# Patient Record
Sex: Male | Born: 1995 | Race: White | Hispanic: No | Marital: Single | Smoking: Current every day smoker
Health system: Southern US, Community
[De-identification: ages and names within clinical notes are randomized; demographics above are authoritative.]

## PROBLEM LIST (undated history)

## (undated) DIAGNOSIS — I219 Acute myocardial infarction, unspecified: Secondary | ICD-10-CM

## (undated) DIAGNOSIS — I498 Other specified cardiac arrhythmias: Secondary | ICD-10-CM

## (undated) DIAGNOSIS — Z9289 Personal history of other medical treatment: Secondary | ICD-10-CM

## (undated) DIAGNOSIS — Z9889 Other specified postprocedural states: Secondary | ICD-10-CM

## (undated) HISTORY — PX: NOSE SURGERY: SHX723

## (undated) HISTORY — PX: OTHER SURGICAL HISTORY: SHX169

---
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
## 2014-02-23 ENCOUNTER — Observation Stay (HOSPITAL_COMMUNITY)
Admission: EM | Admit: 2014-02-23 | Discharge: 2014-02-24 | Disposition: A | Discharge status: Home / Self Care | Payer: Medicaid Other | Attending: Family Medicine | Admitting: Family Medicine

## 2014-02-23 ENCOUNTER — Encounter (HOSPITAL_COMMUNITY): Payer: Self-pay | Admitting: Emergency Medicine

## 2014-02-23 ENCOUNTER — Emergency Department (HOSPITAL_COMMUNITY): Payer: Medicaid Other

## 2014-02-23 DIAGNOSIS — F17200 Nicotine dependence, unspecified, uncomplicated: Secondary | ICD-10-CM | POA: Insufficient documentation

## 2014-02-23 DIAGNOSIS — R079 Chest pain, unspecified: Secondary | ICD-10-CM

## 2014-02-23 DIAGNOSIS — R9431 Abnormal electrocardiogram [ECG] [EKG]: Secondary | ICD-10-CM | POA: Insufficient documentation

## 2014-02-23 DIAGNOSIS — Z23 Encounter for immunization: Secondary | ICD-10-CM | POA: Insufficient documentation

## 2014-02-23 HISTORY — DX: Acute myocardial infarction, unspecified: I21.9

## 2014-02-23 LAB — CBC WITH DIFFERENTIAL/PLATELET
Basophils Absolute: 0 10*3/uL (ref 0.0–0.1)
Basophils Relative: 0 % (ref 0–1)
Eosinophils Absolute: 0.1 10*3/uL (ref 0.0–0.7)
Eosinophils Relative: 2 % (ref 0–5)
HEMATOCRIT: 48.3 % (ref 39.0–52.0)
HEMOGLOBIN: 17.1 g/dL — AB (ref 13.0–17.0)
LYMPHS PCT: 33 % (ref 12–46)
Lymphs Abs: 2.2 10*3/uL (ref 0.7–4.0)
MCH: 31.6 pg (ref 26.0–34.0)
MCHC: 35.4 g/dL (ref 30.0–36.0)
MCV: 89.3 fL (ref 78.0–100.0)
MONO ABS: 0.6 10*3/uL (ref 0.1–1.0)
MONOS PCT: 10 % (ref 3–12)
NEUTROS ABS: 3.7 10*3/uL (ref 1.7–7.7)
Neutrophils Relative %: 55 % (ref 43–77)
Platelets: 203 10*3/uL (ref 150–400)
RBC: 5.41 MIL/uL (ref 4.22–5.81)
RDW: 12 % (ref 11.5–15.5)
WBC: 6.6 10*3/uL (ref 4.0–10.5)

## 2014-02-23 LAB — BASIC METABOLIC PANEL
BUN: 11 mg/dL (ref 6–23)
CHLORIDE: 100 meq/L (ref 96–112)
CO2: 25 meq/L (ref 19–32)
CREATININE: 0.8 mg/dL (ref 0.50–1.35)
Calcium: 9.3 mg/dL (ref 8.4–10.5)
GFR calc Af Amer: >90 mL/min (ref >90)
GFR calc non Af Amer: >90 mL/min (ref >90)
GLUCOSE: 103 mg/dL — AB (ref 70–99)
Potassium: 3.8 mEq/L (ref 3.7–5.3)
Sodium: 137 mEq/L (ref 137–147)

## 2014-02-23 LAB — RAPID URINE DRUG SCREEN, HOSP PERFORMED
Amphetamines: NOT DETECTED
BARBITURATES: NOT DETECTED
Benzodiazepines: NOT DETECTED
Cocaine: NOT DETECTED
Opiates: NOT DETECTED
Tetrahydrocannabinol: POSITIVE — AB

## 2014-02-23 LAB — D-DIMER, QUANTITATIVE: D-Dimer, Quant: 0.4 ug/mL-FEU (ref 0.00–0.48)

## 2014-02-23 LAB — TROPONIN I
Troponin I: <0.3 ng/mL (ref <0.30)
Troponin I: <0.3 ng/mL (ref <0.30)

## 2014-02-23 MED ORDER — NITROGLYCERIN 0.4 MG SL SUBL
0.4000 mg | SUBLINGUAL_TABLET | SUBLINGUAL | Status: AC | PRN
  Administered 2014-02-23 (×3): 0.4 mg via SUBLINGUAL
  Filled 2014-02-23: qty 1

## 2014-02-23 MED ORDER — NITROGLYCERIN 0.4 MG SL SUBL
0.4000 mg | SUBLINGUAL_TABLET | SUBLINGUAL | Status: DC | PRN
  Administered 2014-02-24: 0.4 mg via SUBLINGUAL
  Filled 2014-02-23: qty 1

## 2014-02-23 MED ORDER — MORPHINE SULFATE 2 MG/ML IJ SOLN: 2.0000 mg | INTRAMUSCULAR | Status: DC | PRN

## 2014-02-23 MED ORDER — ACETAMINOPHEN 325 MG PO TABS: 650.0000 mg | ORAL_TABLET | Freq: Four times a day (QID) | ORAL | Status: DC | PRN

## 2014-02-23 MED ORDER — NIFEDIPINE ER OSMOTIC RELEASE 30 MG PO TB24
30.0000 mg | ORAL_TABLET | Freq: Every day | ORAL | Status: DC
  Administered 2014-02-24: 30 mg via ORAL
  Filled 2014-02-23: qty 1

## 2014-02-23 MED ORDER — ASPIRIN 81 MG PO CHEW
324.0000 mg | CHEWABLE_TABLET | Freq: Once | ORAL | Status: AC
  Administered 2014-02-23: 324 mg via ORAL
  Filled 2014-02-23: qty 4

## 2014-02-23 MED ORDER — PNEUMOCOCCAL VAC POLYVALENT 25 MCG/0.5ML IJ INJ
0.5000 mL | INJECTION | INTRAMUSCULAR | Status: AC
  Administered 2014-02-24: 0.5 mL via INTRAMUSCULAR
  Filled 2014-02-23: qty 0.5

## 2014-02-23 MED ORDER — SODIUM CHLORIDE 0.9 % IV BOLUS (SEPSIS)
500.0000 mL | Freq: Once | INTRAVENOUS | Status: AC
  Administered 2014-02-23: 500 mL via INTRAVENOUS

## 2014-02-23 NOTE — ED Provider Notes (Signed)
CSN: [id number]     Arrival date & time 02/23/14  1033 History  This chart was scribed for American Expressathan R. Rubin PayorPickering, MD by Quintella ReichertMatthew Underwood, ED scribe.  This patient was seen in room APA06/[facility]06 and the patient's care was started at 10:46 AM.   Chief Complaint  Patient presents with  . Chest Pain    The history is provided by the patient. No language interpreter was used.    HPI Comments: Chad Craig is a 18 y.o. 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  Pt states he had some mild cramping in his chest last night but he was not concerned at that time and it resolved quickly.  This morning he awoke at 9:07 AM with sharp, severe, pain in the left side of his chest which has been constant since then.  He characterizes pain as similar to his prior MI except pain radiated down left arm on that occasion and his current pain is non-radiating.  Pain is worsened by deep breathing but he denies any other SOB.  He denies leg swelling, fever, or cough.  Pt reports he has otherwise been fine for the past few days and has been able to engage in physical activity without difficulty.  He reports that this is the first episode of chest pain he has had in some time.  He sees a cardiologist every 6 months and at his most recent appointment was told "everything looks great."  He notes that he experienced intermittent chest pain for the first year after his MI but but since then he has had very little issues.  His most recent cardiac issue was this year in January when he "almost passed out."  Pt has been given nitroglycerin in previous episodes but they did not help and pt states "they just made it worse."  He is a smoker.  He denies any other drug use including cocaine.   Past Medical History  Diagnosis Date  . Myocardial infarction     per  family and pt    Past Surgical History  Procedure Laterality Date  . Cardiac catheterization      No family history on file.   History  Substance Use Topics  . Smoking status: Current Every Day Smoker    Types: Cigarettes  . Smokeless tobacco: Not on file  . Alcohol Use: Yes     Review of Systems  Constitutional: Negative for fever.  Respiratory: Negative for cough and shortness of breath.   Cardiovascular: Positive for chest pain. Negative for leg swelling.  All other systems reviewed and are negative.     Allergies  Review of patient's allergies indicates no known allergies.  Home Medications   Prior to Admission medications   Not on File   BP 116/79  Pulse 65  Temp(Src) 97.9 F (36.6 C) (Axillary)  Resp 16  SpO2 100%  Physical Exam  Nursing note and vitals reviewed. Constitutional: He is oriented to person, place, and time. He appears well-developed and well-nourished. No distress.  HENT:  Head: Normocephalic and atraumatic.  Eyes: EOM are normal.  Neck: Neck supple. No tracheal deviation present.  Cardiovascular: Normal rate, regular rhythm and normal heart sounds.   Pulmonary/Chest: Effort normal and breath sounds normal. No respiratory distress. He has no wheezes. He has no rales.  Musculoskeletal: Normal range  of motion.  Neurological: He is alert and oriented to person, place, and time.  Skin: Skin is warm and dry.  Psychiatric: He has a normal mood and affect. His behavior is normal.    ED Course  Procedures (including critical care time)  COORDINATION OF CARE: 10:51 AM-Discussed treatment plan which includes cardiac workup with pt at bedside and pt agreed to plan.     Labs Review Labs Reviewed  CBC WITH DIFFERENTIAL - Abnormal; Notable for the following:    Hemoglobin 17.1 (*)    All other components within normal limits  BASIC METABOLIC PANEL - Abnormal; Notable for the following:    Glucose, Bld 103 (*)    All other components  within normal limits  TROPONIN I  TROPONIN I    Imaging Review Dg Chest 2 View  02/23/2014   CLINICAL DATA:  Chest pain  EXAM: CHEST  2 VIEW  COMPARISON:  07/28/2011  FINDINGS: The cardiomediastinal silhouette is unremarkable.  There is no evidence of focal airspace disease, pulmonary edema, suspicious pulmonary nodule/mass, pleural effusion, or pneumothorax. No acute bony abnormalities are identified.  IMPRESSION: No active cardiopulmonary disease.   Electronically Signed   By: [name]  Hu M.D.   On: 02/23/2014 11:36     EKG Interpretation None      Date: 02/23/2014  Rate: 69  Rhythm: Ectopic atrial rhythm versus high junctional  QRS Axis: normal  Intervals: normal  ST/T Wave abnormalities: ST elevations inferiorly  Conduction Disutrbances:none  Narrative Interpretation: Multiple EKGs done. Mild inferior ST elevation, worse in II. T wave inversion in lead III  Old EKG Reviewed: changes noted   MDM   Final diagnoses:  Chest pain    Patient with chest pain. History of troponin elevation after corner spasm versus myocarditis a few years ago. States that this feels the same. EKG shows ST changes and some T wave changes. [facility] He initially recommended that patient stay here and get an echocardiogram tomorrow, as long as there was no severe EKG changes or troponin elevation. Discussed with the hospitalist, who requested a three-hour troponin. A three-hour troponin is negative, and the hospitalist will discuss with the cardiologist.   I personally performed the services described in this documentation, which was scribed in my presence. The recorded information has been reviewed and is accurate.     Juliet RudeNathan R. Rubin PayorPickering, MD 02/23/14 1539

## 2014-02-23 NOTE — H&P (Addendum)
History and Physical  Chad Craig [id number] DOB: 08-02-1996 DOA: 02/23/2014  Referring physician: Chest pain PCP: [name],[name], MD  Cardiologist: [name], [facility]  Chief Complaint: Chest pain  HPI:  18 year old man with cardiac history outlined below presented to the emergency department with complaints of chest pain with acute onset today. Initial evaluation notable for negative troponins. Multiple EKGs, however felt to be at baseline per cardiologist. In discussion with his cardiologist: It was felt the patient could be observed here with echocardiogram planned for tomorrow.  History obtained from chart review as well as patient. He has felt well lately, had no exertional symptoms or shortness of breath, no recent travel or leg swelling. He has no history of blood clots. Today he developed acute onset of centralized as chest pain 10/10 with radiation to his right arm with nausea but no vomiting, somewhat worse with inspiration. No shortness of breath. Symptoms felt somewhat similar to 4 years ago. He denies street drugs, on prescribed medications, Adderall or stimulants.  According to cardiologist's note from 09/2013 "past history which began in with [facility] with substernal chest pain and EKG concerning for inferoposterior STEMI at the age of 18. He was taken emergently to the cath lab where LHC did not reveal any CAD. He did not have an anomalous origin of his vessels. LVEDP was normal and there was no evidence of Takotsubo cardiomyopathy on ventriculography. He had a TTE done which showed low-normal EF (50-55%), normal  diastolic function, normal right heart function and no significant valvular disease. There was no evidence of intracardiac or intrapulmonary shunt on bubble study. Hypercoaguable work-up was sent which was WNL. He had lower extremity dopplers which did not show any evidence of clot but did show enlarged lymph nodes in the groin  bilaterally. He had a cardiac MRI which showed mildly reduced EF (calculated at 44%) with mildly dilated LV and LA but no evidence of inflammation or scar. His UTox was positive only for cannabinoids, no cocaine or amphetamine. Troponin peaked at 22. His presumptive discharge diagnosis was myo-pericarditis versus possible coronary spasm. He was ultimately started on nifedipine XL 30mg  qhs"  In the emergency department now to be afebrile stable vital signs. No hypoxia. Treat with nitroglycerin twice. Troponins negative 3 hours apart. Basic metabolic panel, CBC unremarkable. Chest x-ray no acute changes. Multiple EKGs obtained showed sinus rhythm with abnormal ST segments especially II, III with abnormal T waves in the same leads. These are felt to be similar to previous EKGs per his cardiologist.  Review of Systems:  Negative for fever, visual changes, sore throat, rash, viral symptoms, new muscle aches, SOB, dysuria, bleeding, v/abdominal pain.  Past Medical History  Diagnosis Date  . Myocardial infarction     per family and pt    Past Surgical History  Procedure Laterality Date  . Cardiac catheterization      Social History:  reports that he has been smoking Cigarettes.  He has been smoking about 0.00 packs per day. He does not have any smokeless tobacco history on file. He reports that he drinks alcohol. His drug history is not on file.  No Known Allergies  No family history on file. No particular medical problems in the family.  Prior to Admission medications   Medication Sig Start Date End Date Taking? Authorizing Provider  NIFEdipine (PROCARDIA-XL/ADALAT-CC/NIFEDICAL-XL) 30 MG 24 hr tablet Take 30 mg by mouth daily.   Yes Historical Provider, MD   Physical Exam:  Filed Vitals:   02/23/14 1041 02/23/14 1100 02/23/14 1130 02/23/14 1303  BP: 116/79 116/81 112/77 111/80  Pulse: 65 67 69 67  Temp: 97.9 F (36.6 C)     TempSrc: Axillary     Resp: 16 18 16 18   SpO2: 100% 98% 98%  100%    General: Examined in emergency department.  Appears calm and comfortable.  Contact smiles. Appears very comfortable. Eyes: PERRL, normal lids, irises  ENT: grossly normal hearing, lips & tongue Neck: no LAD, masses or thyromegaly Cardiovascular: RRR, no m/r/g. No LE edema. Respiratory: CTA bilaterally, no w/r/r. Normal respiratory effort. Abdomen: soft, ntnd Skin: no rash or induration seen. Multiple tattoos. Musculoskeletal: grossly normal tone BUE/BLE Psychiatric: grossly normal mood and affect, speech fluent and appropriate Neurologic: grossly non-focal.  Wt Readings from Last 3 Encounters:  No data found for Wt    Labs on Admission:  Basic Metabolic Panel:  Recent Labs Lab 02/23/14 1056  NA 137  K 3.8  CL 100  CO2 25  GLUCOSE 103*  BUN 11  CREATININE 0.80  CALCIUM 9.3     CBC:  Recent Labs Lab 02/23/14 1056  WBC 6.6  NEUTROABS 3.7  HGB 17.1*  HCT 48.3  MCV 89.3  PLT 203    Cardiac Enzymes:  Recent Labs Lab 02/23/14 1056 02/23/14 1414  TROPONINI <0.30 <0.30      Radiological Exams on Admission: Dg Chest 2 View  02/23/2014   CLINICAL DATA:  Chest pain  EXAM: CHEST  2 VIEW  COMPARISON:  07/28/2011  FINDINGS: The cardiomediastinal silhouette is unremarkable.  There is no evidence of focal airspace disease, pulmonary edema, suspicious pulmonary nodule/mass, pleural effusion, or pneumothorax. No acute bony abnormalities are identified.  IMPRESSION: No active cardiopulmonary disease.   Electronically Signed   By: [name]  Hu M.D.   On: 02/23/2014 11:36    EKG: Independently reviewed. Multiple EKGs reviewed, shows sinus rhythm with ST abnormalities and T wave abnormalities in leads II, III, less so in aVF   Active Problems:   Chest pain   Assessment/Plan 1. Chest pain, etiology unclear. Improved with nitroglycerin. Case was discussed in detail with the patient's cardiologist Dr. [name] who has reviewed all the EKGs and clinical data. He  indicated EKG abnormalities are old when compared to studies available to him January 2013 and August 2012. The possibility of myocarditis cannot be excluded at this point. He recommended observation, 2-D echocardiogram in the morning, serial troponins. [facility]. These recommendations were discussed with the patient and with his mother and both indicated they wish to stay here. Wells score = 0. He appears quite comfortable and there are no signs to suggest dissection or vascular abnormality. 2. Tobacco dependence recommend cessation.   Plan observation. Serial troponin. EKG and echocardiogram in the morning. Treat pain.   If condition changes, troponins elevate or pain worsens, will consider transfer.  Code Status: full code  DVT prophylaxis:early ambulation Family Communication:  Disposition Plan/Anticipated LOS: obs, 24 hours  Time spent: 90 minutes  Brendia Sacksaniel Kenyana Husak, MD  [facility] Pager [phone] 02/23/2014, 4:31 PM

## 2014-02-23 NOTE — ED Notes (Signed)
Pt rates chest pain as a 5 on pain scale prior to nitro,

## 2014-02-23 NOTE — ED Notes (Signed)
Pt resting semi fowlers on stretcher watching race on tv, rates chest pain as a 5 on pain scale, states that is where the pain has been most of the time he has been having it, pt and family updated on plan of care and admission,

## 2014-02-23 NOTE — ED Notes (Signed)
Pt requesting something to eat, per Dr Rubin PayorPickering pt may eat and drink, pt's mom going to pick pt up something to eat,

## 2014-02-23 NOTE — ED Notes (Signed)
EDP notified of increasing pain.  Order for additional sl nitro

## 2014-02-23 NOTE — ED Notes (Signed)
Dr. Goodrich at bedside,

## 2014-02-23 NOTE — ED Notes (Signed)
Pt is here with 10/10 cp in left chest.  Pt denies any sob.  Pt has some nausea, no diaphoresis.  Pt states that it hurts to take a breath.  Pt reports hx of MI with "spasm of coronary artery" pt had cardiac cath at the time.  No drug use.  Mother at the bedside.

## 2014-02-24 DIAGNOSIS — R079 Chest pain, unspecified: Secondary | ICD-10-CM

## 2014-02-24 DIAGNOSIS — R072 Precordial pain: Secondary | ICD-10-CM

## 2014-02-24 LAB — TROPONIN I
Troponin I: <0.3 ng/mL (ref <0.30)
Troponin I: <0.3 ng/mL (ref <0.30)

## 2014-02-24 NOTE — Discharge Summary (Signed)
Physician Discharge Summary  Chad Craig [id number] DOB: 04-21-1996 DOA: 02/23/2014  PCP: Chad HectorNYLAND,[name], MD  Admit date: 02/23/2014 Discharge date: 02/24/2014 Cardiologist: Chad NumberAkinniran [facility]isogun, [facility]  Recommendations for Outpatient Follow-up:  1. Chest pain. Patient will followup with his primary cardiologist in the next one to 2 weeks, to be arranged by you and see per discussion with cardiologist on call.   Follow-up Information   Follow up with Chad HectorNYLAND,[name], MD. (As needed)    Specialty:  Family Medicine   Contact information:   [address] [place] [id number] [phone]       [facility] Craig [facility]. Schedule an appointment as soon as possible for a visit in 1 week.     Discharge Diagnoses:  1. Chest pain. 2. Abnormal EKG.  Discharge Condition: Improved Disposition: Home.  Diet recommendation: Regular.  Filed Weights   02/23/14 1832  Weight: 54.7 kg (120 lb 9.5 oz)    History of present illness:  18 year old man with cardiac history outlined below presented to the emergency department with complaints of chest pain with acute onset. Initial evaluation notable for negative troponins. Multiple EKGs, however felt to be at baseline per cardiologist. In discussion with his cardiologist: It was felt the patient could be observed at [facility] with echocardiogram planned.  Hospital Course:  Patient was observed overnight, troponins negative, chest pain resolved. Now completely pain-free without shortness of breath.   1. Chest pain. Completely resolved. Etiology and significance unclear. This was discussed in detail with the patient's cardiologist Dr. Aliene Craig 5/3 felt at that time admission EKGs were baseline for patient. He recommended echocardiogram and serial troponin. Troponins negative. Echocardiogram unremarkable. EKG remains abnormal. Patient's primary cardiologist is out of town, on-call cardiologist recommended discussing with  cardiology here. Case reviewed in detail with Dr. Diona Craig who has reviewed all EKGs since admission as well as those on file, echo and case and recommends no further workup. Of note, patient has persistent and variable abnormalities in the inferior leads by comparison to multiple previous EKGs in system as well as [facility]  Plan discharge home today with [facility] Discussed with his mother bedside.  Discharge Instructions  Discharge Orders   Future Orders Complete By Expires   Diet general  As directed    Discharge instructions  As directed        Medication List         NIFEdipine 30 MG 24 hr tablet  Commonly known as:  PROCARDIA-XL/ADALAT-CC/NIFEDICAL-XL  Take 30 mg by mouth daily.       No Known Allergies  The results of significant diagnostics from this hospitalization (including imaging, microbiology, ancillary and laboratory) are listed below for reference.    Significant Diagnostic Studies: Dg Chest 2 View  02/23/2014   CLINICAL DATA:  Chest pain  EXAM: CHEST  2 VIEW  COMPARISON:  07/28/2011  FINDINGS: The cardiomediastinal silhouette is unremarkable.  There is no evidence of focal airspace disease, pulmonary edema, suspicious pulmonary nodule/mass, pleural effusion, or pneumothorax. No acute bony abnormalities are identified.  IMPRESSION: No active cardiopulmonary disease.   Electronically Signed   By: Chad Craig  Hu M.D.   On: 02/23/2014 11:36     Labs: Basic Metabolic Panel:  Recent Labs Lab 02/23/14 1056  NA 137  K 3.8  CL 100  CO2 25  GLUCOSE 103*  BUN 11  CREATININE 0.80  CALCIUM 9.3   CBC:  Recent  Labs Lab 02/23/14 1056  WBC 6.6  NEUTROABS 3.7  HGB 17.1*  HCT 48.3  MCV 89.3  PLT 203   Cardiac Enzymes:  Recent Labs Lab 02/23/14 1056 02/23/14 1414 02/23/14 1955 02/24/14 0122 02/24/14 0818  TROPONINI <0.30 <0.30 <0.30 <0.30 <0.30    Active Problems:   Chest pain   Time coordinating  discharge: 40 minutes  Signed:  Brendia Sacksaniel Goodrich, MD [facility] 02/24/2014, 4:00 PM

## 2014-02-24 NOTE — Progress Notes (Signed)
  PROGRESS NOTE  Chad Craig XBJ:[id number] DOB: 08-May-1996 DOA: 02/23/2014 PCP: Chad Craig,Chad ROBERT, MD Cardiologist: Chad NumberAkinniran Abisogun, MD at [facility]  Summary: [age] year old man with cardiac history outlined below presented to the emergency department with complaints of chest pain with acute onset. Initial evaluation notable for negative troponins. Multiple EKGs, however felt to be at baseline per cardiologist. In discussion with his cardiologist: It was felt the patient could be observed at [facility] with echocardiogram planned.  Assessment/Plan: 1. Chest pain. Completely resolved. Etiology and significance unclear. This was discussed in detail with the patient's cardiologist Dr. [name] 5/3 felt at that time admission EKGs were baseline for patient. He recommended echocardiogram and serial troponin. Troponins negative. Echocardiogram unremarkable.   Pain completely resolved. Patient's doing well and would like to go home. Workup unremarkable including negative troponins and echocardiogram.   EKG remains abnormal. Patient's primary cardiologist is out of town, on-call cardiologist recommended discussing with cardiology here. Case reviewed in detail with Dr. Diona Craig who has reviewed all EKGs, echo and case and recommends no further workup. Of note, patient has persistent and variable abnormalities in the inferior leads by comparison to multiple previous EKGs in system as well [facility]  Plan discharge home today with outpatient [facility]  Chad Karge, MD  [facility]  Pager [phone] If 7PM-7AM, please contact night-coverage at [email or web address], password [email or web address] 02/24/2014, 2:09 PM  LOS: 1 day   Consultants:    Procedures:  2-D echocardiogram. Left ventricular ejection fraction 55-60%. Normal diastolic function. No pericardial effusion  HPI/Subjective: No problems overnight. CP has completely resolved. No shortness of breath. No complaints.  Wants to go home.  Objective: Filed Vitals:   02/24/14 0220 02/24/14 0420 02/24/14 0807 02/24/14 1125  BP: 100/64 98/63 119/84 115/77  Pulse: 58 52 65 63  Temp: 97.8 F (36.6 C) 97.5 F (36.4 C)  98.2 F (36.8 C)  TempSrc: Oral Oral    Resp: 20 20  18   Height:      Weight:      SpO2: 97% 98%  97%    Intake/Output Summary (Last 24 hours) at 02/24/14 1409 Last data filed at 02/23/14 2250  Gross per 24 hour  Intake    480 ml  Output    250 ml  Net    230 ml     Filed Weights   02/23/14 1832  Weight: 54.7 kg (120 lb 9.5 oz)    Exam:   Afebrile, vital signs are stable. No hypoxia.  Gen. Appears calm and comfortable. Speech fluent and clear. Well-appearing.  Cardiovascular. Regular rate and rhythm. No murmur, rub or gallop. No lower extremity edema. Sinus rhythm.  Respiratory. Clear to auscultation bilaterally. No wheezes, rales or rhonchi. Normal respiratory effort.  Psychiatric per grossly normal mood and affect. Speech fluent and appropriate.  Data Reviewed:  Troponins negative.  D-dimer negative.  Urine drug screen positive for marijuana  Scheduled Meds: . NIFEdipine  30 mg Oral Daily  . pneumococcal 23 valent vaccine  0.5 mL Intramuscular Tomorrow-1000   Continuous Infusions:   Active Problems:   Chest pain

## 2014-02-24 NOTE — Progress Notes (Signed)
Patient discharged home accompanied by mother.  Discharge instructions were given and patient demonstrated understanding. IV was removed and patient gathered all belongings. Patient was given a doctor's note for work. Patient left in a stable condition.

## 2014-02-24 NOTE — Progress Notes (Signed)
UR Completed.  Andrews Tener Jane Loveta Dellis [phone] 02/24/2014  

## 2014-02-24 NOTE — Progress Notes (Signed)
*  PRELIMINARY RESULTS* Echocardiogram 2D Echocardiogram has been performed.  Chad PullerJohanna R Linnaea Craig 02/24/2014, 10:13 AM

## 2014-03-08 ENCOUNTER — Encounter (HOSPITAL_COMMUNITY)
Admission: EM | Disposition: A | Discharge status: Home / Self Care | Payer: Self-pay | Source: Other Acute Inpatient Hospital | Attending: Cardiovascular Disease

## 2014-03-08 ENCOUNTER — Encounter (HOSPITAL_COMMUNITY): Payer: Self-pay | Admitting: Cardiovascular Disease

## 2014-03-08 ENCOUNTER — Inpatient Hospital Stay (HOSPITAL_COMMUNITY)
Admission: EM | Admit: 2014-03-08 | Discharge: 2014-03-09 | DRG: 287 | Disposition: A | Discharge status: Home / Self Care | Payer: Medicaid Other | Source: Other Acute Inpatient Hospital | Attending: Cardiovascular Disease | Admitting: Cardiovascular Disease

## 2014-03-08 DIAGNOSIS — R079 Chest pain, unspecified: Secondary | ICD-10-CM | POA: Diagnosis present

## 2014-03-08 DIAGNOSIS — K044 Acute apical periodontitis of pulpal origin: Secondary | ICD-10-CM | POA: Diagnosis present

## 2014-03-08 DIAGNOSIS — F329 Major depressive disorder, single episode, unspecified: Secondary | ICD-10-CM | POA: Diagnosis present

## 2014-03-08 DIAGNOSIS — Z87891 Personal history of nicotine dependence: Secondary | ICD-10-CM

## 2014-03-08 DIAGNOSIS — I201 Angina pectoris with documented spasm: Principal | ICD-10-CM | POA: Diagnosis present

## 2014-03-08 DIAGNOSIS — I309 Acute pericarditis, unspecified: Secondary | ICD-10-CM

## 2014-03-08 DIAGNOSIS — F3289 Other specified depressive episodes: Secondary | ICD-10-CM | POA: Diagnosis present

## 2014-03-08 DIAGNOSIS — I252 Old myocardial infarction: Secondary | ICD-10-CM

## 2014-03-08 DIAGNOSIS — I319 Disease of pericardium, unspecified: Secondary | ICD-10-CM | POA: Diagnosis present

## 2014-03-08 DIAGNOSIS — F1210 Cannabis abuse, uncomplicated: Secondary | ICD-10-CM | POA: Diagnosis present

## 2014-03-08 HISTORY — DX: Personal history of other medical treatment: Z92.89

## 2014-03-08 HISTORY — DX: Other specified postprocedural states: Z98.890

## 2014-03-08 HISTORY — PX: LEFT HEART CATHETERIZATION WITH CORONARY ANGIOGRAM: SHX5451

## 2014-03-08 LAB — CBC
HCT: 44.7 % (ref 39.0–52.0)
Hemoglobin: 15.3 g/dL (ref 13.0–17.0)
MCH: 31.2 pg (ref 26.0–34.0)
MCHC: 34.2 g/dL (ref 30.0–36.0)
MCV: 91 fL (ref 78.0–100.0)
PLATELETS: 184 10*3/uL (ref 150–400)
RBC: 4.91 MIL/uL (ref 4.22–5.81)
RDW: 12.1 % (ref 11.5–15.5)
WBC: 5.4 10*3/uL (ref 4.0–10.5)

## 2014-03-08 LAB — BASIC METABOLIC PANEL
BUN: 13 mg/dL (ref 6–23)
CALCIUM: 8.8 mg/dL (ref 8.4–10.5)
CO2: 24 mEq/L (ref 19–32)
Chloride: 101 mEq/L (ref 96–112)
Creatinine, Ser: 0.83 mg/dL (ref 0.50–1.35)
GLUCOSE: 86 mg/dL (ref 70–99)
POTASSIUM: 4.1 meq/L (ref 3.7–5.3)
SODIUM: 137 meq/L (ref 137–147)

## 2014-03-08 SURGERY — LEFT HEART CATHETERIZATION WITH CORONARY ANGIOGRAM
Anesthesia: LOCAL

## 2014-03-08 MED ORDER — NITROGLYCERIN 0.4 MG SL SUBL: 0.4000 mg | SUBLINGUAL_TABLET | SUBLINGUAL | Status: DC | PRN

## 2014-03-08 MED ORDER — MORPHINE SULFATE 2 MG/ML IJ SOLN: 1.0000 mg | INTRAMUSCULAR | Status: DC | PRN

## 2014-03-08 MED ORDER — BUPROPION HCL ER (XL) 150 MG PO TB24
150.0000 mg | ORAL_TABLET | Freq: Every day | ORAL | Status: DC
  Administered 2014-03-09: 150 mg via ORAL
  Filled 2014-03-08: qty 1

## 2014-03-08 MED ORDER — HEPARIN SODIUM (PORCINE) 1000 UNIT/ML IJ SOLN
INTRAMUSCULAR | Status: AC
  Filled 2014-03-08: qty 1

## 2014-03-08 MED ORDER — MIDAZOLAM HCL 2 MG/2ML IJ SOLN
INTRAMUSCULAR | Status: AC
  Filled 2014-03-08: qty 2

## 2014-03-08 MED ORDER — SODIUM CHLORIDE 0.9 % IV SOLN
INTRAVENOUS | Status: AC
  Administered 2014-03-08: 75 mL/h via INTRAVENOUS

## 2014-03-08 MED ORDER — AMOXICILLIN 500 MG PO CAPS
500.0000 mg | ORAL_CAPSULE | Freq: Three times a day (TID) | ORAL | Status: DC
  Administered 2014-03-08 – 2014-03-09 (×3): 500 mg via ORAL
  Filled 2014-03-08 (×6): qty 1

## 2014-03-08 MED ORDER — IBUPROFEN 800 MG PO TABS
800.0000 mg | ORAL_TABLET | Freq: Three times a day (TID) | ORAL | Status: DC
  Administered 2014-03-08 – 2014-03-09 (×3): 800 mg via ORAL
  Filled 2014-03-08 (×5): qty 1

## 2014-03-08 MED ORDER — VERAPAMIL HCL 2.5 MG/ML IV SOLN
INTRAVENOUS | Status: AC
  Filled 2014-03-08: qty 2

## 2014-03-08 MED ORDER — BUPROPION HCL ER (XL) 150 MG PO TB24
150.0000 mg | ORAL_TABLET | Freq: Every day | ORAL | Status: DC
  Filled 2014-03-08: qty 1

## 2014-03-08 MED ORDER — FAMOTIDINE 20 MG PO TABS
20.0000 mg | ORAL_TABLET | Freq: Every day | ORAL | Status: DC
  Administered 2014-03-08 – 2014-03-09 (×2): 20 mg via ORAL
  Filled 2014-03-08 (×2): qty 1

## 2014-03-08 MED ORDER — LIDOCAINE HCL (PF) 1 % IJ SOLN
INTRAMUSCULAR | Status: AC
  Filled 2014-03-08: qty 30

## 2014-03-08 MED ORDER — HEPARIN (PORCINE) IN NACL 2-0.9 UNIT/ML-% IJ SOLN
INTRAMUSCULAR | Status: AC
  Filled 2014-03-08: qty 1000

## 2014-03-08 MED ORDER — OXYCODONE-ACETAMINOPHEN 5-325 MG PO TABS: 1.0000 | ORAL_TABLET | ORAL | Status: DC | PRN

## 2014-03-08 MED ORDER — NIFEDIPINE ER 30 MG PO TB24
30.0000 mg | ORAL_TABLET | Freq: Every day | ORAL | Status: DC
  Administered 2014-03-08 – 2014-03-09 (×2): 30 mg via ORAL
  Filled 2014-03-08 (×2): qty 1

## 2014-03-08 MED ORDER — NITROGLYCERIN 2 % TD OINT
0.5000 [in_us] | TOPICAL_OINTMENT | Freq: Four times a day (QID) | TRANSDERMAL | Status: DC
  Administered 2014-03-08 – 2014-03-09 (×3): 0.5 [in_us] via TOPICAL
  Filled 2014-03-08: qty 30

## 2014-03-08 MED ORDER — FENTANYL CITRATE 0.05 MG/ML IJ SOLN
INTRAMUSCULAR | Status: AC
  Filled 2014-03-08: qty 2

## 2014-03-08 NOTE — CV Procedure (Signed)
      Cardiac Catheterization Operative Report  Chad Craig [id number] 5/16/201512:56 PM [name],[name], MD  Procedure Performed:  1. Left Heart Catheterization 2. Selective Coronary Angiography 3. Left ventricular angiogram  Operator: Verne Carrowhristopher Collan Schoenfeld, MD  Arterial access site:  Right radial artery.   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                                  Procedure Details: The risks, benefits, complications, treatment options, and expected outcomes were discussed with the patient. Emergency consent placed on chart. The patient was further sedated with Versed and Fentanyl.  The right wrist was assessed with an Allens test which was positive. The right wrist was prepped and draped in a sterile fashion. 1% lidocaine was used for local anesthesia. Using the modified Seldinger access technique, a 5 French sheath was placed in the right radial artery. 3 mg Verapamil was given through the sheath. 3500 units IV heparin was given. Standard diagnostic catheters were used to perform selective coronary angiography. A pigtail catheter was used to perform a left ventricular angiogram. The sheath was removed from the right radial artery and a Terumo hemostasis band was applied at the arteriotomy site on the right wrist.    There were no immediate complications. The patient was taken to the recovery area in stable condition.   Hemodynamic Findings: Central aortic pressure: 101/71 Left ventricular pressure: 96/7/9  Angiographic Findings:  Left main: No obstructive disease  Left Anterior Descending Artery: Large caliber vessel that courses to the apex. There are several very small caliber diagonal branches. No obstructive disease.   Circumflex Artery: Large caliber vessel  with moderate caliber obtuse marginal branch. No obstructive disease.   Right Coronary Artery: Small to moderate caliber non-dominant vessel with no obstructive disease.   Left Ventricular Angiogram: LVEF=55% with subtle hypokinesis anterior wall.   Impression: 1. No angiographic evidence of CAD 2. Preserved LV systolic function with subtle anterior hypokinesis 3. Chest pain in setting of chronic EKG changes (inferior ST elevation). Possible coronary vasospasm vs pericarditis vs musculoskeletal chest pain with baseline abnormal EKG.   Recommendations: Will admit to telemetry unit. Echo later today to exclude pericardial effusion. Will continue Nifedipine for possible coronary vasospasm. [facility]. Consider changing therapy for vasospasm. Chest has mostly resolved. Will start NSAIDs for possible pericarditis.        Complications:  None. The patient tolerated the procedure well.

## 2014-03-08 NOTE — H&P (Signed)
Patient ID: Chad Craig MRN: [id number] DOB/AGE: 25-Jan-1996 18 y.o. Admit date: 03/08/2014  [facility] Rich Number(Akinniran Abisogun, MD at [facility]  HPI: 18 yo male with a history of Depression, coronary vasospasm admitted via EMS with ongoing chest pain, ST elevation inferior leads. Code STEMI was called by EMS. Pt had onset of pain this am that woke him from his sleep. EKG is overall unchanged from [facility] where he was admitted overnight for same symptoms. He ruled out there. Presumed coronary vasospasm in the past and [facility]. [facility] with normal coronary arteries and normal LV function. He takes Nifedipine 30 mg daily for presumed vasospasm. Ongoing chest pain upon arrival.   Review of systems complete and found to be negative unless listed above   Past Medical History  Diagnosis Date  . Coronary vasospasm     per family and pt    Family History  Problem Relation Age of Onset  . Heart attack Maternal Grandfather     History   Social History  . Marital Status: Single    Spouse Name: N/A    Number of Children: N/A  . Years of Education: N/A   Occupational History  . Not on file.   Social History Main Topics  . Smoking status: Former Smoker -- 1.00 packs/day for 5 years    Types: Cigarettes  . Smokeless tobacco: Not on file  . Alcohol Use: No  . Drug Use: Yes     Comment: Pt smokes marijuana   . Sexual Activity: Not on file   Other Topics Concern  . Not on file   Social History Narrative  . No narrative on file    Past Surgical History  Procedure Laterality Date  . None      No Known Allergies  Prior to Admission Meds:  Prescriptions prior to admission  Medication Sig Dispense Refill  . NIFEdipine (PROCARDIA-XL/ADALAT-CC/NIFEDICAL-XL) 30 MG 24 hr tablet Take 30 mg by mouth daily.      Wellbutrin XL 150 mg daily Amoxicillin 500 mg po TID (for dental infection)  Physical  Exam: 101/71 P70 RR 12    General: Well developed, well nourished, NAD  HEENT: OP clear, mucus membranes moist  SKIN: warm, dry. No rashes.  Neuro: No focal deficits  Musculoskeletal: Muscle strength 5/5 all ext  Psychiatric: Mood and affect normal  Neck: No JVD, no carotid bruits, no thyromegaly, no lymphadenopathy.  Lungs:Clear bilaterally, no wheezes, rhonci, crackles  Cardiovascular: Regular rate and rhythm. No murmurs, gallops or rubs.  Abdomen:Soft. Bowel sounds present. Non-tender.  Extremities: No lower extremity edema. Pulses are 2 + in the bilateral DP/PT.  Labs: pending  EKG: Sinus, rate 70 bpm. ST elevation 1mm inferior and anterolateral leads, slight PR segment depression  Cardiac cath: No CAD. Normal LV function  ASSESSMENT AND PLAN:   1. Chest pain: No evidence of CAD by emergent cardiac cath. His baseline EKG has ST elevation inferiorly ([facility] He has been [facility] for possible [facility]. Will continue Nifedipine for possible spasm. Will start NTG past now as he has continued mild chest pain. May add Imdur in am. Echo to exclude pericardial effusion. Trial of NSAIDs for possible pericarditis.   2. Dental infection: Continue amoxicillin  3. Depression: Continue wellbutrin  Likely d/c home in am if no clinical changes. He will f/u in  [facility].   Earney Hamburghris Aylene Acoff, MD 03/08/2014, 12:53 PM

## 2014-03-08 NOTE — Progress Notes (Signed)
  Echocardiogram 2D Echocardiogram has been performed.  Real [name] 03/08/2014, 4:24 PM

## 2014-03-09 ENCOUNTER — Encounter (HOSPITAL_COMMUNITY): Payer: Self-pay | Admitting: Physician Assistant

## 2014-03-09 DIAGNOSIS — I309 Acute pericarditis, unspecified: Secondary | ICD-10-CM

## 2014-03-09 LAB — CBC
HCT: 45.1 % (ref 39.0–52.0)
Hemoglobin: 15.3 g/dL (ref 13.0–17.0)
MCH: 31.2 pg (ref 26.0–34.0)
MCHC: 33.9 g/dL (ref 30.0–36.0)
MCV: 92 fL (ref 78.0–100.0)
PLATELETS: 219 10*3/uL (ref 150–400)
RBC: 4.9 MIL/uL (ref 4.22–5.81)
RDW: 12.4 % (ref 11.5–15.5)
WBC: 9.7 10*3/uL (ref 4.0–10.5)

## 2014-03-09 LAB — BASIC METABOLIC PANEL
BUN: 11 mg/dL (ref 6–23)
CALCIUM: 9.4 mg/dL (ref 8.4–10.5)
CO2: 28 mEq/L (ref 19–32)
Chloride: 104 mEq/L (ref 96–112)
Creatinine, Ser: 1.03 mg/dL (ref 0.50–1.35)
GFR calc non Af Amer: >90 mL/min (ref >90)
Glucose, Bld: 83 mg/dL (ref 70–99)
Potassium: 4.2 mEq/L (ref 3.7–5.3)
SODIUM: 141 meq/L (ref 137–147)

## 2014-03-09 MED ORDER — IBUPROFEN 800 MG PO TABS: 800.0000 mg | ORAL_TABLET | Freq: Three times a day (TID) | ORAL | Status: DC

## 2014-03-09 MED ORDER — NITROGLYCERIN 0.4 MG SL SUBL: 0.4000 mg | SUBLINGUAL_TABLET | SUBLINGUAL | Status: DC | PRN

## 2014-03-09 MED ORDER — AMOXICILLIN 500 MG PO CAPS: 500.0000 mg | ORAL_CAPSULE | Freq: Three times a day (TID) | ORAL | Status: AC

## 2014-03-09 NOTE — Discharge Summary (Signed)
Discharge Summary   Patient ID: Chad Craig, MRN: [id number], DOB/AGE: Sep 19, 1996 18 y.o.  Admit date: 03/08/2014 Discharge date: 03/09/2014   Primary Care Physician:  [name],[name]   Primary Cardiologist:  Dr.Stormy Fabian [name] [facility]   Reason for Admission:  Chest Pain with ECG concerning for Inferior STEMI   Primary Discharge Diagnoses:  Active Problems:   Chest pain     Wt Readings from Last 3 Encounters:  03/08/14 120 lb 9.5 oz (54.7 kg)  03/08/14 120 lb 9.5 oz (54.7 kg)  02/23/14 120 lb 9.5 oz (54.7 kg)    Secondary Discharge Diagnoses:   Past Medical History  Diagnosis Date  . Coronary vasospasm     hx of MI at age 18 - no CAD at cath; ? myopericarditis vs spasm  . Hx of cardiac cath     a. LHC (03/08/14):  normal cors; EF 55% with subtle ant HK  . Hx of echocardiogram     a. 03/08/14: EF 50%, no WMA, mild MR, no effusion      Allergies:   No Known Allergies    Procedures Performed This Admission:    Cardiac Catheterization 03/08/14: Angiographic Findings:  Left main: No obstructive disease  Left Anterior Descending Artery: Large caliber vessel that courses to the apex. There are several very small caliber diagonal branches. No obstructive disease.  Circumflex Artery: Large caliber vessel with moderate caliber obtuse marginal branch. No obstructive disease.  Right Coronary Artery: Small to moderate caliber non-dominant vessel with no obstructive disease.  Left Ventricular Angiogram: LVEF=55% with subtle hypokinesis anterior wall.  Impression:  1. No angiographic evidence of CAD  2. Preserved LV systolic function with subtle anterior hypokinesis  3. Chest pain in setting of chronic EKG changes (inferior ST elevation). Possible coronary vasospasm vs pericarditis vs musculoskeletal chest pain with baseline abnormal EKG.    Hospital Course:  Chad Craig is a 18 y.o. male with a hx of MI in the setting of myopericarditis vs coronary vasospasm at  the age of 18.  [facility]  [facility] and was observed overnight.  MI was ruled out.  [facility] with persistent chest pain and inferior STE on ECG.  He was brought emergently to the cath lab for inferior STEMI.    LHC demonstrated normal coronary arteries and EF 55% with subtle anterior HK.  Review of his records demonstrated baseline ECG with inferior STE (chronic changes).  Echo was done this AM and demonstrates EF 50%, mild MR, no effusion.  He was kept on Nifedipine for possible spasm.  He was started on Ibuprofen 800 TID to cover for pericarditis.  He was seen by Dr. Donato SchultzMark Skains this AM and felt to be stable for d/c to home.  He may need to start on a trial of Colchicine if he has recurrent episodes.  [facility].     Discharge Vitals:   Blood pressure 107/55, pulse 51, temperature 97.9 F (36.6 C), temperature source Oral, resp. rate 16, height 5' 6.93" (1.7 m), weight 120 lb 9.5 oz (54.7 kg), SpO2 99.00%.   Labs:   Recent Labs  03/08/14 1445 03/09/14 0525  WBC 5.4 9.7  HGB 15.3 15.3  HCT 44.7 45.1  MCV 91.0 92.0  PLT 184 219     Recent Labs  03/08/14 1445 03/09/14 0525  NA 137 141  K 4.1 4.2  CL 101 104  CO2 24 28  BUN 13 11  CREATININE 0.83 1.03  CALCIUM 8.8 9.4     Diagnostic Procedures and Studies:  Dg Chest 2 View  02/23/2014    IMPRESSION: No active cardiopulmonary disease.   Electronically Signed   By: [name]  Hu M.D.   On: 02/23/2014 11:36     2D Echocardiogram 03/08/14: ECHO: EF 50% no WMA  Mild MR  No effusion.  (official report pending in the system)   Disposition:   Pt is being discharged home today in good condition.  Follow-up Plans & Appointments      Follow-up Information   [name] On [facility].      Discharge Medications    Medication List         amoxicillin 500 MG capsule    Commonly known as:  AMOXIL  Take 1 capsule (500 mg total) by mouth every 8 (eight) hours. Take until finished or as previously directed.     aspirin EC 81 MG tablet  Take 324 mg by mouth once.     buPROPion 150 MG 24 hr tablet  Commonly known as:  WELLBUTRIN XL  Take 150 mg by mouth daily.     ibuprofen 800 MG tablet  Commonly known as:  ADVIL,MOTRIN  Take 1 tablet (800 mg total) by mouth 3 (three) times daily.     NIFEdipine 30 MG 24 hr tablet  Commonly known as:  PROCARDIA-XL/ADALAT-CC/NIFEDICAL-XL  Take 30 mg by mouth daily.     nitroGLYCERIN 0.4 MG SL tablet  Commonly known as:  NITROSTAT  Place 1 tablet (0.4 mg total) under the tongue every 5 (five) minutes x 3 doses as needed for chest pain.         Outstanding Labs/Studies  1. None   Duration of Discharge Encounter: Greater than 30 minutes including physician and PA time.  Signed, Tereso NewcomerScott Timara Loma, PA-C   03/09/2014 8:51 AM

## 2014-03-09 NOTE — Progress Notes (Signed)
No CP. Cath site normal.  No rub on exam. Tatoo   ECHO: EF 50% no WMA Mild MR No effusion.   No change from prior.   DC home.   Ibuprofen 800 TID with food Give copy of ECG.  [facility] with Dr. Namon CirriA.  Jafet Wissing, MD

## 2014-03-09 NOTE — Discharge Summary (Signed)
Personally seen and examined. Agree with above. Has follow up [facility] Chronic issue. ? Vasospasm, ? Pericarditis NSAIDs Copy of ECG.   Donato SchultzMark Etheline Geppert, MD

## 2014-07-10 ENCOUNTER — Encounter (HOSPITAL_COMMUNITY): Payer: Self-pay | Admitting: Emergency Medicine

## 2014-07-10 ENCOUNTER — Emergency Department (HOSPITAL_COMMUNITY)
Admission: EM | Admit: 2014-07-10 | Discharge: 2014-07-11 | Disposition: A | Discharge status: Home / Self Care | Payer: Medicaid Other | Attending: Emergency Medicine | Admitting: Emergency Medicine

## 2014-07-10 DIAGNOSIS — I252 Old myocardial infarction: Secondary | ICD-10-CM | POA: Insufficient documentation

## 2014-07-10 DIAGNOSIS — Z9889 Other specified postprocedural states: Secondary | ICD-10-CM | POA: Insufficient documentation

## 2014-07-10 DIAGNOSIS — R220 Localized swelling, mass and lump, head: Secondary | ICD-10-CM | POA: Diagnosis not present

## 2014-07-10 DIAGNOSIS — Z791 Long term (current) use of non-steroidal anti-inflammatories (NSAID): Secondary | ICD-10-CM | POA: Diagnosis not present

## 2014-07-10 DIAGNOSIS — T360X5A Adverse effect of penicillins, initial encounter: Secondary | ICD-10-CM | POA: Diagnosis not present

## 2014-07-10 DIAGNOSIS — R21 Rash and other nonspecific skin eruption: Secondary | ICD-10-CM | POA: Insufficient documentation

## 2014-07-10 DIAGNOSIS — T7840XA Allergy, unspecified, initial encounter: Secondary | ICD-10-CM

## 2014-07-10 DIAGNOSIS — Z7982 Long term (current) use of aspirin: Secondary | ICD-10-CM | POA: Insufficient documentation

## 2014-07-10 DIAGNOSIS — Z87891 Personal history of nicotine dependence: Secondary | ICD-10-CM | POA: Insufficient documentation

## 2014-07-10 DIAGNOSIS — R221 Localized swelling, mass and lump, neck: Secondary | ICD-10-CM

## 2014-07-10 DIAGNOSIS — T40605A Adverse effect of unspecified narcotics, initial encounter: Secondary | ICD-10-CM | POA: Diagnosis not present

## 2014-07-10 HISTORY — DX: Acute myocardial infarction, unspecified: I21.9

## 2014-07-10 MED ORDER — METHYLPREDNISOLONE SODIUM SUCC 125 MG IJ SOLR
125.0000 mg | Freq: Once | INTRAMUSCULAR | Status: AC
  Administered 2014-07-10: 125 mg via INTRAVENOUS

## 2014-07-10 MED ORDER — DIPHENHYDRAMINE HCL 50 MG/ML IJ SOLN
INTRAMUSCULAR | Status: AC
  Filled 2014-07-10: qty 1

## 2014-07-10 MED ORDER — FAMOTIDINE IN NACL 20-0.9 MG/50ML-% IV SOLN
20.0000 mg | Freq: Once | INTRAVENOUS | Status: AC
  Administered 2014-07-10: 20 mg via INTRAVENOUS
  Filled 2014-07-10: qty 50

## 2014-07-10 MED ORDER — METHYLPREDNISOLONE SODIUM SUCC 125 MG IJ SOLR
INTRAMUSCULAR | Status: AC
  Filled 2014-07-10: qty 2

## 2014-07-10 MED ORDER — EPINEPHRINE 0.3 MG/0.3ML IJ SOAJ
0.3000 mg | Freq: Once | INTRAMUSCULAR | Status: AC
  Administered 2014-07-10: 0.3 mg via INTRAMUSCULAR

## 2014-07-10 MED ORDER — DIPHENHYDRAMINE HCL 50 MG/ML IJ SOLN
50.0000 mg | Freq: Once | INTRAMUSCULAR | Status: AC
  Administered 2014-07-10: 50 mg via INTRAVENOUS

## 2014-07-10 MED ORDER — EPINEPHRINE 0.3 MG/0.3ML IJ SOAJ
INTRAMUSCULAR | Status: AC
  Filled 2014-07-10: qty 0.3

## 2014-07-10 NOTE — ED Notes (Signed)
Patient states he has surgery yesterday and was started ton hydrocodone and amoxicillin. Patient states PTA patient began to have rash and shortness of breath, tongue swelling and lips swelling. Patient is A&OX4.

## 2014-07-10 NOTE — ED Notes (Signed)
Pt had surgery yesterday. Started taking meds yesterday as well. Pt presents to ER w/ rash to entire body.

## 2014-07-10 NOTE — ED Provider Notes (Signed)
CSN: [id number]     Arrival date & time 07/10/14  2325 History  This chart was scribed for Chad Lennert, MD by Tonye Royalty, ED Scribe. This patient was seen in room APA15/AP[facility]15 and the patient's care was started at 11:37 PM.    Chief Complaint  Patient presents with  . Allergic Reaction   Patient is a 18 y.o. male presenting with allergic reaction. The history is provided by the patient (pt complains of rash and itiching.  he just started hydrocodone and amoxicillin). No language interpreter was used.  Allergic Reaction Presenting symptoms: rash and swelling   Rash:    Location:  Full body   Quality: itchiness     Severity:  Severe   Onset quality:  Sudden Severity:  Moderate Prior allergic episodes:  No prior episodes Context: animal exposure and medications   Relieved by:  Nothing Worsened by:  Nothing tried Ineffective treatments:  None tried   HPI Comments: Chad Craig is a 17 y.o. male who presents to the Emergency Department complaining of allergic reaction with onset 30 minutes ago. He states he is only taking Amoxicillin and Vicodin, which he started yesterday after his surgery. He complains of rash diffusely over his body. He denies ever having a similar allergic reaction before. He reports associated swelling to his tongue and lips.  Past Medical History  Diagnosis Date  . Coronary vasospasm     hx of MI at age 53 - no CAD at cath; ? myopericarditis vs spasm  . Hx of cardiac cath     a. LHC (03/08/14):  normal cors; EF 55% with subtle ant HK  . Hx of echocardiogram     a. 03/08/14: EF 50%, no WMA, mild MR, no effusion   Past Surgical History  Procedure Laterality Date  . None     Family History  Problem Relation Age of Onset  . Heart attack Maternal Grandfather    History  Substance Use Topics  . Smoking status: Former Smoker -- 1.00 packs/day for 5 years    Types: Cigarettes  . Smokeless tobacco: Not on file  . Alcohol Use: No    Review of Systems   Constitutional: Negative for appetite change and fatigue.  HENT: Negative for congestion, ear discharge and sinus pressure.   Eyes: Negative for discharge.  Respiratory: Negative for cough.   Cardiovascular: Negative for chest pain.  Gastrointestinal: Negative for abdominal pain and diarrhea.  Genitourinary: Negative for frequency and hematuria.  Musculoskeletal: Negative for back pain.  Skin: Positive for rash.       Swelling to tongue and lips  Neurological: Negative for seizures and headaches.  Psychiatric/Behavioral: Negative for hallucinations.      Allergies  Review of patient's allergies indicates no known allergies.  Home Medications   Prior to Admission medications   Medication Sig Start Date End Date Taking? Authorizing Provider  aspirin EC 81 MG tablet Take 324 mg by mouth once.    Historical Provider, MD  buPROPion (WELLBUTRIN XL) 150 MG 24 hr tablet Take 150 mg by mouth daily.    Historical Provider, MD  ibuprofen (ADVIL,MOTRIN) 800 MG tablet Take 1 tablet (800 mg total) by mouth 3 (three) times daily. 03/09/14   Beatrice Lecher, PA-C  NIFEdipine (PROCARDIA-XL/ADALAT-CC/NIFEDICAL-XL) 30 MG 24 hr tablet Take 30 mg by mouth daily.    Historical Provider, MD  nitroGLYCERIN (NITROSTAT) 0.4 MG SL tablet Place 1 tablet (0.4 mg total) under the tongue every 5 (five) minutes x  3 doses as needed for chest pain. 03/09/14   Beatrice Lecher, PA-C   There were no vitals taken for this visit. Physical Exam  Nursing note and vitals reviewed. Constitutional: He is oriented to person, place, and time. He appears well-developed.  HENT:  Head: Normocephalic.  Eyes: Conjunctivae and EOM are normal. No scleral icterus.  Neck: Neck supple. No thyromegaly present.  Cardiovascular: Normal rate and regular rhythm.  Exam reveals no gallop and no friction rub.   No murmur heard. Pulmonary/Chest: No stridor. He has no wheezes. He has no rales. He exhibits no tenderness.  Abdominal: He exhibits  no distension. There is no tenderness. There is no rebound.  Musculoskeletal: Normal range of motion. He exhibits no edema.  Lymphadenopathy:    He has no cervical adenopathy.  Neurological: He is oriented to person, place, and time. He exhibits normal muscle tone. Coordination normal.  Skin: Rash noted.  Rash throughout entire body, swelling to tongue and lips and around eyes  Psychiatric: He has a normal mood and affect. His behavior is normal.    ED Course  Procedures (including critical care time) Labs Review Labs Reviewed - No data to display  Imaging Review No results found.   EKG Interpretation None     DIAGNOSTIC STUDIES: Oxygen Saturation is 96% on room air, normal by my interpretation.    COORDINATION OF CARE:   MDM   Final diagnoses:  None    The chart was scribed for me under my direct supervision.  I personally performed the history, physical, and medical decision making and all procedures in the evaluation of this patient.. Allergic reaction.   Stop amox and vicodin.  Start motrin and bactrim   Chad Lennert, MD 07/11/14 [phone]

## 2014-07-11 MED ORDER — SULFAMETHOXAZOLE-TRIMETHOPRIM 800-160 MG PO TABS: 1.0000 | ORAL_TABLET | Freq: Two times a day (BID) | ORAL | Status: DC

## 2014-07-11 NOTE — Discharge Instructions (Signed)
Stop taking amoxicillin and hydrocodone and use motrin for pain.  Take benadryl for any rash or itching

## 2014-07-11 NOTE — ED Notes (Signed)
Rash has cleared up & pt is feeling better at this time.

## 2014-09-10 ENCOUNTER — Emergency Department (HOSPITAL_COMMUNITY)
Admission: EM | Admit: 2014-09-10 | Discharge: 2014-09-10 | Disposition: A | Discharge status: Home / Self Care | Payer: Medicaid Other | Attending: Emergency Medicine | Admitting: Emergency Medicine

## 2014-09-10 ENCOUNTER — Encounter (HOSPITAL_COMMUNITY): Payer: Self-pay | Admitting: *Deleted

## 2014-09-10 ENCOUNTER — Emergency Department (HOSPITAL_COMMUNITY): Payer: Medicaid Other

## 2014-09-10 DIAGNOSIS — I252 Old myocardial infarction: Secondary | ICD-10-CM | POA: Diagnosis not present

## 2014-09-10 DIAGNOSIS — Z79899 Other long term (current) drug therapy: Secondary | ICD-10-CM | POA: Insufficient documentation

## 2014-09-10 DIAGNOSIS — Z9889 Other specified postprocedural states: Secondary | ICD-10-CM | POA: Diagnosis not present

## 2014-09-10 DIAGNOSIS — Z87891 Personal history of nicotine dependence: Secondary | ICD-10-CM | POA: Diagnosis not present

## 2014-09-10 DIAGNOSIS — Z880 Allergy status to penicillin: Secondary | ICD-10-CM | POA: Insufficient documentation

## 2014-09-10 DIAGNOSIS — R079 Chest pain, unspecified: Secondary | ICD-10-CM | POA: Diagnosis not present

## 2014-09-10 LAB — BASIC METABOLIC PANEL
ANION GAP: 12 (ref 5–15)
BUN: 12 mg/dL (ref 6–23)
CHLORIDE: 105 meq/L (ref 96–112)
CO2: 26 meq/L (ref 19–32)
Calcium: 9 mg/dL (ref 8.4–10.5)
Creatinine, Ser: 0.88 mg/dL (ref 0.50–1.35)
GFR calc Af Amer: >90 mL/min (ref >90)
GFR calc non Af Amer: >90 mL/min (ref >90)
Glucose, Bld: 92 mg/dL (ref 70–99)
POTASSIUM: 4 meq/L (ref 3.7–5.3)
Sodium: 143 mEq/L (ref 137–147)

## 2014-09-10 LAB — CBC
HEMATOCRIT: 44.9 % (ref 39.0–52.0)
HEMOGLOBIN: 15.7 g/dL (ref 13.0–17.0)
MCH: 31.9 pg (ref 26.0–34.0)
MCHC: 35 g/dL (ref 30.0–36.0)
MCV: 91.3 fL (ref 78.0–100.0)
Platelets: 177 10*3/uL (ref 150–400)
RBC: 4.92 MIL/uL (ref 4.22–5.81)
RDW: 12.6 % (ref 11.5–15.5)
WBC: 7 10*3/uL (ref 4.0–10.5)

## 2014-09-10 LAB — TROPONIN I: Troponin I: <0.3 ng/mL (ref <0.30)

## 2014-09-10 MED ORDER — ASPIRIN 81 MG PO CHEW
324.0000 mg | CHEWABLE_TABLET | Freq: Once | ORAL | Status: AC
  Administered 2014-09-10: 324 mg via ORAL
  Filled 2014-09-10: qty 4

## 2014-09-10 MED ORDER — ISOSORBIDE MONONITRATE ER 30 MG PO TB24: 45.0000 mg | ORAL_TABLET | Freq: Every day | ORAL | Status: DC

## 2014-09-10 MED ORDER — AMLODIPINE BESYLATE 2.5 MG PO TABS: 2.5000 mg | ORAL_TABLET | Freq: Every day | ORAL | Status: DC

## 2014-09-10 MED ORDER — NITROGLYCERIN 0.4 MG SL SUBL
0.4000 mg | SUBLINGUAL_TABLET | SUBLINGUAL | Status: DC | PRN
  Administered 2014-09-10: 0.4 mg via SUBLINGUAL
  Filled 2014-09-10: qty 1

## 2014-09-10 NOTE — ED Notes (Signed)
Patient with no complaints at this time. Respirations even and unlabored. Skin warm/dry. Discharge instructions reviewed with patient at this time. Patient given opportunity to voice concerns/ask questions. IV removed per policy and band-aid applied to site. Patient discharged at this time and left Emergency Department with steady gait.  

## 2014-09-10 NOTE — ED Notes (Signed)
Patient wanted to know how much longer before results.

## 2014-09-10 NOTE — ED Notes (Addendum)
Pt states he has 3 prior heart attacks, first one at are [age], last one was May 2015, states [place] in [place] states he has "artery spasms". Pt has taken 1 nitro at 0600, eased pain from 8/10, 5/10. Pt states the pain started at 2030 last night. Pt did not take ASA today, states he was out. Denies drug use other than prescription medication.

## 2014-09-10 NOTE — ED Notes (Signed)
Pt reports was relaxing on the couch last night around 8:30 pm and started having chest pain.  Reports is a throbbing pain in center of chest that doesn't radiate anywhere else.  Pt says pain got worse during the night so took a nitro at 6am.  Prior to nitro pt says pain was at 8, after nitro pain decreased to 5.  Denies any sob, n/v.  Says felt very hot but not diaphoretic.  Pt alert and oriented.  Nitro and asa given in ED.

## 2014-09-10 NOTE — ED Provider Notes (Signed)
CSN: [id number]     Arrival date & time 09/10/14  0704 History  This chart was scribed for Vida RollerBrian D Emrey Thornley, MD by Annye AsaAnna Dorsett, ED Scribe. This patient was seen in room APA04/APA04 and the patient's care was started at 7:29 AM.    Chief Complaint  Patient presents with  . Chest Pain   The history is provided by the patient. No language interpreter was used.     HPI Comments: Chad Craig is a 18 y.o. male with past medical history of an MI at [age] (with elevated troponin of 22 but had a normal cardiac cath) who presents to the Emergency Department complaining of chest pain, beginning at 20:30 last night.   Patient explains he was lying on the couch, resting, when he noticed gradual onset chest pain; his symptoms began as a heartburn sensation and increased to a throbbing pain. His pain was not worsened upon lying down; it does not radiate. It has not radiated with this episode, though with past heart attacks it has radiated to his back. His pain worsened throughout the night, so he took a nitro this morning - which brought pain from an 8/10 to a 5/10. He did not take ASA. He does note a slight throat itch with this episode. He denies BLE edema, diarrhea, constipation, blood in stool, difficulty urinating, coughing, rhinorrhea, sore throat. He denies drug use other than his prescription medication. He is a former smoker.   Patient was previously admitted in May for CP; at this time, his echocardiogram was unremarkable and serial troponins were normal. Patient reports that he has three previous episodes of concerning chest pain, one 4 years PTA at 18yo and two in May of this year (May 2 and Mar 05 2014); he states that his cardiologist told him at that time that his "cardiac enzymes were up." He had a cardiac cath on Mar 09 2014; this showed no obvious reasons, no obvious vasospasm. There was no troponin drawn at that time. No other documented MIs other than 4 years PTA.   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 He describes this as "afib arrhythmia"/paroxysmal afib.   Past Medical History  Diagnosis Date  . Coronary vasospasm     hx of MI at age 18 - no CAD at cath; ? myopericarditis vs spasm  . Hx of cardiac cath     a. LHC (03/08/14):  normal cors; EF 55% with subtle ant HK  . Hx of echocardiogram     a. 03/08/14: EF 50%, no WMA, mild MR, no effusion  . Heart attack     x3   Past Surgical History  Procedure Laterality Date  . None    . Nose surgery     Family History  Problem Relation Age of Onset  . Heart attack Maternal Grandfather    History  Substance Use Topics  . Smoking status: Former Smoker -- 1.00 packs/day for 5 years    Types: Cigarettes  . Smokeless tobacco: Not on file  . Alcohol Use: No    Review of Systems  HENT: Negative for rhinorrhea and sore throat.   Respiratory: Negative for cough.   Cardiovascular: Positive for chest pain.  Gastrointestinal: Negative for diarrhea, constipation and blood in stool.  Genitourinary: Negative for difficulty urinating.  Musculoskeletal: Negative for back pain.  All other systems reviewed and are negative.  Allergies  Penicillins  Home Medications   Prior to Admission medications   Medication Sig Start Date End Date Taking? Authorizing Provider  methocarbamol (ROBAXIN) 500 MG tablet Take 500 mg by mouth 3 (three) times daily.  08/25/14  Yes Historical Provider, MD  nitroGLYCERIN (NITROSTAT) 0.4 MG SL tablet Place 1 tablet (0.4 mg total) under the tongue every 5 (five) minutes x 3 doses as needed for chest pain. 03/09/14  Yes Scott Moishe Spice, PA-C  pantoprazole (PROTONIX) 20 MG tablet Take 20 mg by mouth daily.  03/18/14 03/18/15 Yes Historical Provider, MD  amLODipine (NORVASC) 2.5 MG tablet Take 1 tablet (2.5 mg total) by mouth daily. 09/10/14   Vida Roller, MD  ibuprofen (ADVIL,MOTRIN) 800 MG tablet Take 1  tablet (800 mg total) by mouth 3 (three) times daily. Patient not taking: Reported on 09/10/2014 03/09/14   Beatrice Lecher, PA-C  isosorbide mononitrate (IMDUR) 30 MG 24 hr tablet Take 1.5 tablets (45 mg total) by mouth daily. 09/10/14   Vida Roller, MD  sulfamethoxazole-trimethoprim (SEPTRA DS) 800-160 MG per tablet Take 1 tablet by mouth 2 (two) times daily. Patient not taking: Reported on 09/10/2014 07/11/14   Benny Lennert, MD   BP 101/66 mmHg  Pulse 61  Temp(Src) 97.6 F (36.4 C) (Oral)  Resp 20  Ht 5\' 7"  (1.702 m)  Wt 120 lb (54.432 kg)  BMI 18.79 kg/m2  SpO2 100% Physical Exam  Constitutional: He is oriented to person, place, and time. He appears well-developed and well-nourished.  HENT:  Head: Normocephalic and atraumatic.  Neck: No tracheal deviation present.  Cardiovascular: Normal rate.   No ectopy  Strong peripheral pulses No peripheral edema   Pulmonary/Chest: Effort normal.  Neurological: He is alert and oriented to person, place, and time.  Skin: Skin is warm and dry.  Psychiatric: He has a normal mood and affect. His behavior is normal.  Nursing note and vitals reviewed.   ED Course  Procedures   DIAGNOSTIC STUDIES: Oxygen Saturation is 98% on RA, normal by my interpretation.    COORDINATION OF CARE: 7:39 AM Discussed treatment plan with pt at bedside and pt agreed to plan.   Labs Review Labs Reviewed  BASIC METABOLIC PANEL  CBC  TROPONIN I  TROPONIN I    Imaging Review Dg Chest Port 1 View  09/10/2014   CLINICAL DATA:  Chest pain.  EXAM: PORTABLE CHEST - 1 VIEW  COMPARISON:  02/23/2014  FINDINGS: Subtle vascularity are normal and the lungs are clear. No osseous abnormality.  IMPRESSION: Normal chest.   Electronically Signed   By: Geanie Cooley M.D.   On: 09/10/2014 07:43     EKG Interpretation   Date/Time:  Wednesday September 10 2014 07:12:14 EST Ventricular Rate:  69 PR Interval:  113 QRS Duration: 92 QT Interval:  366 QTC  Calculation: 392 R Axis:   91 Text Interpretation:  Sinus arrhythmia Borderline short PR interval  Borderline right axis deviation Abnormal ekg Since last tracing ST  segments not as elevated, P waves upright Confirmed by Estelene Carmack  MD, Asees Manfredi  ([id number]) on 09/10/2014 7:27:32 AM      MDM   Final diagnoses:  Chest pain, unspecified chest pain type   D/w Cardiology Fellow at [facility] - Dr. Lucienne Minks -  Increase IMDUR if possible, may change to amlodipine from nifedipine as alternative. (2.5mg  amlodipine) ] 2 neg troponins. - stable for d/c. - pain almost gone.  I personally performed the services described in this documentation,  which was scribed in my presence. The recorded information has been reviewed and is accurate.        Vida RollerBrian D Dorsey Authement, MD 09/11/14 [phone]

## 2014-09-10 NOTE — Discharge Instructions (Signed)
Please stop taking Procardia, you should start amlodipine daily and increase the dose of your isorbide to 45 mg once a day.  Please call your doctor for a followup appointment within 24-48 hours. When you talk to your doctor please let them know that you were seen in the emergency department and have them acquire all of your records so that they can discuss the findings with you and formulate a treatment plan to fully care for your new and ongoing problems.

## 2014-10-02 ENCOUNTER — Encounter (HOSPITAL_COMMUNITY): Payer: Self-pay | Admitting: Cardiovascular Disease

## 2015-01-23 ENCOUNTER — Encounter (HOSPITAL_COMMUNITY): Payer: Self-pay

## 2015-01-23 ENCOUNTER — Emergency Department (HOSPITAL_COMMUNITY)
Admission: EM | Admit: 2015-01-23 | Discharge: 2015-01-23 | Disposition: A | Discharge status: Home / Self Care | Payer: Self-pay | Attending: Emergency Medicine | Admitting: Emergency Medicine

## 2015-01-23 ENCOUNTER — Emergency Department (HOSPITAL_COMMUNITY): Payer: Self-pay

## 2015-01-23 DIAGNOSIS — Y9289 Other specified places as the place of occurrence of the external cause: Secondary | ICD-10-CM | POA: Insufficient documentation

## 2015-01-23 DIAGNOSIS — Z880 Allergy status to penicillin: Secondary | ICD-10-CM | POA: Insufficient documentation

## 2015-01-23 DIAGNOSIS — X58XXXA Exposure to other specified factors, initial encounter: Secondary | ICD-10-CM | POA: Insufficient documentation

## 2015-01-23 DIAGNOSIS — Z79899 Other long term (current) drug therapy: Secondary | ICD-10-CM | POA: Insufficient documentation

## 2015-01-23 DIAGNOSIS — Z87891 Personal history of nicotine dependence: Secondary | ICD-10-CM | POA: Insufficient documentation

## 2015-01-23 DIAGNOSIS — Z8679 Personal history of other diseases of the circulatory system: Secondary | ICD-10-CM | POA: Insufficient documentation

## 2015-01-23 DIAGNOSIS — Y9389 Activity, other specified: Secondary | ICD-10-CM | POA: Insufficient documentation

## 2015-01-23 DIAGNOSIS — S4992XA Unspecified injury of left shoulder and upper arm, initial encounter: Secondary | ICD-10-CM | POA: Insufficient documentation

## 2015-01-23 DIAGNOSIS — Y998 Other external cause status: Secondary | ICD-10-CM | POA: Insufficient documentation

## 2015-01-23 DIAGNOSIS — Z9889 Other specified postprocedural states: Secondary | ICD-10-CM | POA: Insufficient documentation

## 2015-01-23 MED ORDER — HYDROCODONE-ACETAMINOPHEN 5-325 MG PO TABS: 1.0000 | ORAL_TABLET | ORAL | Status: DC | PRN

## 2015-01-23 MED ORDER — HYDROCODONE-ACETAMINOPHEN 5-325 MG PO TABS
1.0000 | ORAL_TABLET | Freq: Once | ORAL | Status: AC
  Administered 2015-01-23: 1 via ORAL
  Filled 2015-01-23: qty 1

## 2015-01-23 MED ORDER — NAPROXEN 500 MG PO TABS: 500.0000 mg | ORAL_TABLET | Freq: Two times a day (BID) | ORAL | Status: DC
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
## 2015-01-23 NOTE — ED Notes (Signed)
Pt reports lifting a dog house when he felt a " pop" to left shoulder. Limited ROM. Full sensation to fingers.

## 2015-01-23 NOTE — ED Notes (Signed)
Patient able to externally and internally rotate L shoulder. Able to raise L arm to 90 degrees laterally.

## 2015-01-23 NOTE — ED Notes (Signed)
Patient with no complaints at this time. Respirations even and unlabored. Skin warm/dry. Discharge instructions reviewed with patient at this time. Patient given opportunity to voice concerns/ask questions. IV removed per policy and band-aid applied to site. Patient discharged at this time and left Emergency Department with steady gait.  

## 2015-03-15 ENCOUNTER — Emergency Department (HOSPITAL_COMMUNITY)
Admission: EM | Admit: 2015-03-15 | Discharge: 2015-03-15 | Disposition: A | Discharge status: Home / Self Care | Payer: Self-pay | Attending: Emergency Medicine | Admitting: Emergency Medicine

## 2015-03-15 ENCOUNTER — Encounter (HOSPITAL_COMMUNITY): Payer: Self-pay | Admitting: *Deleted

## 2015-03-15 DIAGNOSIS — Z79899 Other long term (current) drug therapy: Secondary | ICD-10-CM | POA: Insufficient documentation

## 2015-03-15 DIAGNOSIS — Y929 Unspecified place or not applicable: Secondary | ICD-10-CM | POA: Insufficient documentation

## 2015-03-15 DIAGNOSIS — S70361A Insect bite (nonvenomous), right thigh, initial encounter: Secondary | ICD-10-CM | POA: Insufficient documentation

## 2015-03-15 DIAGNOSIS — W57XXXA Bitten or stung by nonvenomous insect and other nonvenomous arthropods, initial encounter: Secondary | ICD-10-CM | POA: Insufficient documentation

## 2015-03-15 DIAGNOSIS — Y939 Activity, unspecified: Secondary | ICD-10-CM | POA: Insufficient documentation

## 2015-03-15 DIAGNOSIS — Y999 Unspecified external cause status: Secondary | ICD-10-CM | POA: Insufficient documentation

## 2015-03-15 DIAGNOSIS — R21 Rash and other nonspecific skin eruption: Secondary | ICD-10-CM

## 2015-03-15 DIAGNOSIS — Z87891 Personal history of nicotine dependence: Secondary | ICD-10-CM | POA: Insufficient documentation

## 2015-03-15 DIAGNOSIS — Z880 Allergy status to penicillin: Secondary | ICD-10-CM | POA: Insufficient documentation

## 2015-03-15 DIAGNOSIS — S30860A Insect bite (nonvenomous) of lower back and pelvis, initial encounter: Secondary | ICD-10-CM | POA: Insufficient documentation

## 2015-03-15 DIAGNOSIS — Z791 Long term (current) use of non-steroidal anti-inflammatories (NSAID): Secondary | ICD-10-CM | POA: Insufficient documentation

## 2015-03-15 DIAGNOSIS — Z9889 Other specified postprocedural states: Secondary | ICD-10-CM | POA: Insufficient documentation

## 2015-03-15 DIAGNOSIS — S70362A Insect bite (nonvenomous), left thigh, initial encounter: Secondary | ICD-10-CM | POA: Insufficient documentation

## 2015-03-15 DIAGNOSIS — L509 Urticaria, unspecified: Secondary | ICD-10-CM | POA: Insufficient documentation

## 2015-03-15 DIAGNOSIS — I252 Old myocardial infarction: Secondary | ICD-10-CM | POA: Insufficient documentation

## 2015-03-15 MED ORDER — HYDROCORTISONE 2.5 % EX CREA: TOPICAL_CREAM | Freq: Two times a day (BID) | CUTANEOUS | Status: DC | PRN

## 2015-03-15 NOTE — Discharge Instructions (Signed)
Take benadryl as needed for itching, and use hydrocortisone cream as prescribed. Continue your usual home medications. Get plenty of rest and drink plenty of fluids. Avoid any known triggers. Please followup with your primary doctor for discussion of your diagnoses and further evaluation after today's visit; return to the ER for changes or worsening symptoms.   Bedbugs Bedbugs are tiny bugs that live in and around beds. During the day, they hide in mattresses and other places near beds. They come out at night and bite people lying in bed. They need blood to live and grow. Bedbugs can be found in beds anywhere. Usually, they are found in places where many people come and go (hotels, shelters, hospitals). It does not matter whether the place is dirty or clean. Getting bitten by bedbugs rarely causes a medical problem. The biggest problem can be getting rid of them. This often takes the work of a Oncologistpest control expert. CAUSES  Less use of pesticides. Bedbugs were common before the 1950s. Then, strong pesticides such as DDT nearly wiped them out. Today, these pesticides are not used because they harm the environment and can cause health problems.  More travel. Besides mattresses, bedbugs can also live in clothing and luggage. They can come along as people travel from place to place. Bedbugs are more common in certain parts of the world. When people travel to those areas, the bugs can come home with them.  Presence of birds and bats. Bedbugs often infest birds and bats. If you have these animals in or near your home, bedbugs may infest your house, too. SYMPTOMS It does not hurt to be bitten by a bedbug. You will probably not wake up when you are bitten. Bedbugs usually bite areas of the skin that are not covered. Symptoms may show when you wake up, or they may take a day or more to show up. Symptoms may include:  Small red bumps on the skin. These might be lined up in a row or clustered in a group.  A  darker red dot in the middle of red bumps.  Blisters on the skin. There may be swelling and very bad itching. These may be signs of an allergic reaction. This does not happen often. DIAGNOSIS Bedbug bites might look and feel like other types of insect bites. The bugs do not stay on the body like ticks or lice. They bite, drop off, and crawl away to hide. Your caregiver will probably:  Ask about your symptoms.  Ask about your recent activities and travel.  Check your skin for bedbug bites.  Ask you to check at home for signs of bedbugs. You should look for:  Spots or stains on the bed or nearby. This could be from bedbugs that were crushed or from their eggs or waste.  Bedbugs themselves. They are reddish-brown, oval, and flat. They do not fly. They are about the size of an apple seed.  Places to look for bedbugs include:  Beds. Check mattresses, headboards, box springs, and bed frames.  On drapes and curtains near the bed.  Under carpeting in the bedroom.  Behind electrical outlets.  Behind any wallpaper that is peeling.  Inside luggage. TREATMENT Most bedbug bites do not need treatment. They usually go away on their own in a few days. The bites are not dangerous. However, treatment may be needed if you have scratched so much that your skin has become infected. You may also need treatment if you are allergic to bedbug bites.  Treatment options include:  A drug that stops swelling and itching (corticosteroid). Usually, a cream is rubbed on the skin. If you have a bad rash, you may be given a corticosteroid pill.  Oral antihistamines. These are pills to help control itching.  Antibiotic medicines. An antibiotic may be prescribed for infected skin. HOME CARE INSTRUCTIONS   Take any medicine prescribed by your caregiver for your bites. Follow the directions carefully.  Consider wearing pajamas with long sleeves and pant legs.  Your bedroom may need to be treated. A pest  control expert should make sure the bedbugs are gone. You may need to throw away mattresses or luggage. Ask the pest control expert what you can do to keep the bedbugs from coming back. Common suggestions include:  Putting a plastic cover over your mattress.  Washing and drying your clothes and bedding in hot water and a hot dryer. The temperature should be hotter than 120 F (48.9 C). Bedbugs are killed by high temperatures.  Vacuuming carefully all around your bed. Vacuum in all cracks and crevices where the bugs might hide. Do this often.  Carefully checking all used furniture, bedding, or clothes that you bring into your house.  Eliminating bird nests and bat roosts.  If you get bedbug bites when traveling, check all your possessions carefully before bringing them into your house. If you find any bugs on clothes or in your luggage, consider throwing those items away. SEEK MEDICAL CARE IF:  You have red bug bites that keep coming back.  You have red bug bites that itch badly.  You have bug bites that cause a skin rash.  You have scratch marks that are red and sore. SEEK IMMEDIATE MEDICAL CARE IF: You have a fever. Document Released: 11/12/2010 Document Revised: 01/02/2012 Document Reviewed: 11/12/2010 [facility] Patient Information 2015 [place], [place]. This information is not intended to replace advice given to you by your health care provider. Make sure you discuss any questions you have with your health care provider.  Insect Bite Mosquitoes, flies, fleas, bedbugs, and other insects can bite. Insect bites are different from insect stings. The bite may be red, puffy (swollen), and itchy for 2 to 4 days. Most bites get better on their own. HOME CARE   Do not scratch the bite.  Keep the bite clean and dry. Wash the bite with soap and water.  Put ice on the bite.  Put ice in a plastic bag.  Place a towel between your skin and the bag.  Leave the ice on for 20 minutes, 4  times a day. Do this for the first 2 to 3 days, or as told by your doctor.  You may use medicated lotions or creams to lessen itching as told by your doctor.  Only take medicines as told by your doctor.  If you are given medicines (antibiotics), take them as told. Finish them even if you start to feel better. You may need a tetanus shot if:  You cannot remember when you had your last tetanus shot.  You have never had a tetanus shot.  The injury broke your skin. If you need a tetanus shot and you choose not to have one, you may get tetanus. Sickness from tetanus can be serious. GET HELP RIGHT AWAY IF:   You have more pain, redness, or puffiness.  You see a red line on the skin coming from the bite.  You have a fever.  You have joint pain.  You have a headache  or neck pain.  You feel weak.  You have a rash.  You have chest pain, or you are short of breath.  You have belly (abdominal) pain.  You feel sick to your stomach (nauseous) or throw up (vomit).  You feel very tired or sleepy. MAKE SURE YOU:   Understand these instructions.  Will watch your condition.  Will get help right away if you are not doing well or get worse. Document Released: 10/07/2000 Document Revised: 01/02/2012 Document Reviewed: 05/11/2011 [facility] Patient Information 2015 [place], [place]. This information is not intended to replace advice given to you by your health care provider. Make sure you discuss any questions you have with your health care provider.  Rash A rash is a change in the color or feel of your skin. There are many different types of rashes. You may have other problems along with your rash. HOME CARE  Avoid the thing that caused your rash.  Do not scratch your rash.  You may take cools baths to help stop itching.  Only take medicines as told by your doctor.  Keep all doctor visits as told. GET HELP RIGHT AWAY IF:   Your pain, puffiness (swelling), or redness gets  worse.  You have a fever.  You have new or severe problems.  You have body aches, watery poop (diarrhea), or you throw up (vomit).  Your rash is not better after 3 days. MAKE SURE YOU:   Understand these instructions.  Will watch your condition.  Will get help right away if you are not doing well or get worse. Document Released: 03/28/2008 Document Revised: 01/02/2012 Document Reviewed: 07/25/2011 [facility] Patient Information 2015 [place], [place]. This information is not intended to replace advice given to you by your health care provider. Make sure you discuss any questions you have with your health care provider.

## 2015-03-15 NOTE — ED Notes (Signed)
Pt in stating he stayed with a friend last night and when he woke up had bug bites to his back and torso, no distress noted

## 2015-03-15 NOTE — ED Provider Notes (Signed)
CSN: [id number]     Arrival date & time 03/15/15  2217 History  This chart was scribed for non-physician practitioner working, [employer], PA-C, with Glynn Octave, MD, by Modena Jansky, ED Scribe. This patient was seen in room TR06C/TR06C and the patient's care was started at 10:32 PM.  Chief Complaint  Patient presents with  . Insect Bite   Patient is a 19 y.o. male presenting with rash. The history is provided by the patient. No language interpreter was used.  Rash Location:  Leg and torso Torso rash location:  Lower back Leg rash location:  L leg and R leg Quality: itchiness and redness   Quality: not blistering, not draining, not painful and not weeping   Severity:  Moderate Onset quality:  Gradual Duration:  1 day Timing:  Constant Progression:  Spreading Chronicity:  New Context: insect bite/sting   Context: not animal contact, not exposure to similar rash, not hot tub use, not medications, not new detergent/soap, not plant contact and not sick contacts   Relieved by: salt bath. Exacerbated by: air exposure. Ineffective treatments: vinegar. Associated symptoms: no abdominal pain, no fever, no joint pain, no myalgias, no nausea, no periorbital edema, no shortness of breath, no throat swelling, no tongue swelling, not vomiting and not wheezing    HPI Comments: Chad Craig is a 19 y.o. male with a PMHx of coronary vasospasms, who presents to the Emergency Department complaining of a constant moderate rash that started last night. He reports that the rash started off as a single suspected mosquito bite when he was staying at his friend's house last night, and has spread since then, now involves his lower back and posterior thighs bilaterally. He describes the rash as itchy and red, without any pain or drainage. He reports that he took a salt bath with temporary relief, and taking his shirt off exacerbates the itching. He tried vinegar without relief. He denies any sick  contacts, exposure to similar rashes, new personal care products, animal/plant exposure, or IV drug use. He also denies any fever, tongue swelling, throat swelling, facial swelling, trouble swallowing, difficulty breathing, chest pain, SOB, wheezing, dizziness, nausea, vomiting, or abdominal pain. Denies red streaking or drainage from the areas. No hx of DM or immunocompromising conditions.    Past Medical History  Diagnosis Date  . Coronary vasospasm     hx of MI at age 38 - no CAD at cath; ? myopericarditis vs spasm  . Hx of cardiac cath     a. LHC (03/08/14):  normal cors; EF 55% with subtle ant HK  . Hx of echocardiogram     a. 03/08/14: EF 50%, no WMA, mild MR, no effusion  . Heart attack     x3   Past Surgical History  Procedure Laterality Date  . None    . Nose surgery    . Left heart catheterization with coronary angiogram N/A 03/08/2014    Procedure: LEFT HEART CATHETERIZATION WITH CORONARY ANGIOGRAM;  Surgeon: Kathleene Hazel, MD;  Location: [facility] CATH LAB;  Service: Cardiovascular;  Laterality: N/A;   Family History  Problem Relation Age of Onset  . Heart attack Maternal Grandfather    History  Substance Use Topics  . Smoking status: Former Smoker -- 1.00 packs/day for 5 years    Types: Cigarettes  . Smokeless tobacco: Not on file  . Alcohol Use: No    Review of Systems  Constitutional: Negative for fever and chills.  HENT: Negative for facial swelling  and trouble swallowing.   Respiratory: Negative for shortness of breath and wheezing.   Cardiovascular: Negative for chest pain.  Gastrointestinal: Negative for nausea, vomiting and abdominal pain.  Musculoskeletal: Negative for myalgias and arthralgias.  Skin: Positive for rash.  Allergic/Immunologic: Negative for immunocompromised state.  Neurological: Negative for weakness and numbness.  10 Systems reviewed and all are negative for acute change except as noted in the HPI.  Allergies  Penicillins  Home  Medications   Prior to Admission medications   Medication Sig Start Date End Date Taking? Authorizing Provider  amLODipine (NORVASC) 2.5 MG tablet Take 1 tablet (2.5 mg total) by mouth daily. 09/10/14   Eber HongBrian Miller, MD  HYDROcodone-acetaminophen (NORCO/VICODIN) 5-325 MG per tablet Take 1 tablet by mouth every 4 (four) hours as needed. 01/23/15   Hope Orlene OchM Neese, NP  ibuprofen (ADVIL,MOTRIN) 800 MG tablet Take 1 tablet (800 mg total) by mouth 3 (three) times daily. Patient not taking: Reported on 09/10/2014 03/09/14   Beatrice LecherScott T Weaver, PA-C  isosorbide mononitrate (IMDUR) 30 MG 24 hr tablet Take 1.5 tablets (45 mg total) by mouth daily. 09/10/14   Eber HongBrian Miller, MD  methocarbamol (ROBAXIN) 500 MG tablet Take 500 mg by mouth 3 (three) times daily.  08/25/14   Historical Provider, MD  naproxen (NAPROSYN) 500 MG tablet Take 1 tablet (500 mg total) by mouth 2 (two) times daily. 01/23/15   Hope Orlene OchM Neese, NP  nitroGLYCERIN (NITROSTAT) 0.4 MG SL tablet Place 1 tablet (0.4 mg total) under the tongue every 5 (five) minutes x 3 doses as needed for chest pain. 03/09/14   Beatrice LecherScott T Weaver, PA-C  pantoprazole (PROTONIX) 20 MG tablet Take 20 mg by mouth daily.  03/18/14 03/18/15  Historical Provider, MD  sulfamethoxazole-trimethoprim (SEPTRA DS) 800-160 MG per tablet Take 1 tablet by mouth 2 (two) times daily. Patient not taking: Reported on 09/10/2014 07/11/14   Bethann BerkshireJoseph Zammit, MD   BP 104/38 mmHg  Pulse 76  Temp(Src) 98 F (36.7 C) (Oral)  Resp 18  Wt 135 lb (61.236 kg)  SpO2 97% Physical Exam  Constitutional: He is oriented to person, place, and time. Vital signs are normal. He appears well-developed and well-nourished.  Non-toxic appearance. No distress.  Afebrile, nontoxic, NAD  HENT:  Head: Normocephalic and atraumatic.  Mouth/Throat: Mucous membranes are normal.  Eyes: Conjunctivae and EOM are normal. Right eye exhibits no discharge. Left eye exhibits no discharge.  Neck: Normal range of motion. Neck supple.   Cardiovascular: Normal rate.   Pulmonary/Chest: Effort normal. No respiratory distress.  Abdominal: Normal appearance. He exhibits no distension.  Musculoskeletal: Normal range of motion.  Neurological: He is alert and oriented to person, place, and time. He has normal strength. No sensory deficit.  Skin: Skin is warm, dry and intact. Rash noted. Rash is urticarial. There is erythema (to insect bites).  Several small erythematous urticarial-type insect bites to R lower back and posterior thighs bilaterally. No interdigital webspace involvement, no burrowing or vesicles, no surrounding cellulitic areas, no abscesses, no warmth or induration.   Psychiatric: He has a normal mood and affect.  Nursing note and vitals reviewed.   ED Course  Procedures (including critical care time) DIAGNOSTIC STUDIES: Oxygen Saturation is 97% on RA, normal by my interpretation.    COORDINATION OF CARE: 10:36 PM- Pt advised of plan for treatment and pt agrees.  Labs Review Labs Reviewed - No data to display  Imaging Review No results found.   EKG Interpretation None  MDM   Final diagnoses:  Rash  Insect bites    19 y.o. male here with insect bites to back and legs after sleeping at a friends house. No burrowing, no interdigital webspace involvement. No cellulitis or abscess. Appears to be urticarial insect bites, could be bed bugs, although no other affected individuals. No new soaps/detergents/exposures. Will treat as insect bite with hydrocortisone cream and benadryl as needed for itching. Doubt scabies or fungal infection. Discussed cleaning his clothing in hot water which he had done already. Will have him f/up with PCP in 1wk to recheck. I explained the diagnosis and have given explicit precautions to return to the ER including for any other new or worsening symptoms. The patient understands and accepts the medical plan as it's been dictated and I have answered their questions. Discharge  instructions concerning home care and prescriptions have been given. The patient is STABLE and is discharged to home in good condition.   I personally performed the services described in this documentation, which was scribed in my presence. The recorded information has been reviewed and is accurate.  BP 104/38 mmHg  Pulse 76  Temp(Src) 98 F (36.7 C) (Oral)  Resp 18  Wt 135 lb (61.236 kg)  SpO2 97%  Meds ordered this encounter  Medications  . hydrocortisone 2.5 % cream    Sig: Apply topically 2 (two) times daily as needed (itching).    Dispense:  15 g    Refill:  0    Order Specific Question:  Supervising Provider    Answer:  Eber Hong [[id number]]     Janney Priego Camprubi-Soms, PA-C 03/15/15 0454  Glynn Octave, MD 03/16/15 0130

## 2015-10-07 ENCOUNTER — Encounter (HOSPITAL_COMMUNITY): Payer: Self-pay

## 2015-10-07 ENCOUNTER — Emergency Department (HOSPITAL_COMMUNITY): Payer: Self-pay

## 2015-10-07 ENCOUNTER — Emergency Department (HOSPITAL_COMMUNITY)
Admission: EM | Admit: 2015-10-07 | Discharge: 2015-10-08 | Disposition: A | Discharge status: Home / Self Care | Payer: Self-pay | Attending: Emergency Medicine | Admitting: Emergency Medicine

## 2015-10-07 DIAGNOSIS — Z9889 Other specified postprocedural states: Secondary | ICD-10-CM | POA: Insufficient documentation

## 2015-10-07 DIAGNOSIS — M545 Low back pain: Secondary | ICD-10-CM | POA: Insufficient documentation

## 2015-10-07 DIAGNOSIS — Z87891 Personal history of nicotine dependence: Secondary | ICD-10-CM | POA: Insufficient documentation

## 2015-10-07 DIAGNOSIS — I252 Old myocardial infarction: Secondary | ICD-10-CM | POA: Insufficient documentation

## 2015-10-07 DIAGNOSIS — R0789 Other chest pain: Secondary | ICD-10-CM | POA: Insufficient documentation

## 2015-10-07 DIAGNOSIS — Z791 Long term (current) use of non-steroidal anti-inflammatories (NSAID): Secondary | ICD-10-CM | POA: Insufficient documentation

## 2015-10-07 DIAGNOSIS — Z79899 Other long term (current) drug therapy: Secondary | ICD-10-CM | POA: Insufficient documentation

## 2015-10-07 DIAGNOSIS — Z880 Allergy status to penicillin: Secondary | ICD-10-CM | POA: Insufficient documentation

## 2015-10-07 LAB — CBC
HEMATOCRIT: 46.3 % (ref 39.0–52.0)
Hemoglobin: 15.8 g/dL (ref 13.0–17.0)
MCH: 32.1 pg (ref 26.0–34.0)
MCHC: 34.1 g/dL (ref 30.0–36.0)
MCV: 94.1 fL (ref 78.0–100.0)
PLATELETS: 210 10*3/uL (ref 150–400)
RBC: 4.92 MIL/uL (ref 4.22–5.81)
RDW: 13 % (ref 11.5–15.5)
WBC: 8.9 10*3/uL (ref 4.0–10.5)

## 2015-10-07 LAB — BASIC METABOLIC PANEL
Anion gap: 12 (ref 5–15)
BUN: 13 mg/dL (ref 6–20)
CO2: 27 mmol/L (ref 22–32)
CREATININE: 0.92 mg/dL (ref 0.61–1.24)
Calcium: 8.8 mg/dL — ABNORMAL LOW (ref 8.9–10.3)
Chloride: 100 mmol/L — ABNORMAL LOW (ref 101–111)
GFR calc Af Amer: >60 mL/min (ref >60)
GFR calc non Af Amer: >60 mL/min (ref >60)
Glucose, Bld: 92 mg/dL (ref 65–99)
Potassium: 3.6 mmol/L (ref 3.5–5.1)
SODIUM: 139 mmol/L (ref 135–145)

## 2015-10-07 LAB — DIFFERENTIAL
Basophils Absolute: 0 10*3/uL (ref 0.0–0.1)
Basophils Relative: 0 %
EOS ABS: 0.2 10*3/uL (ref 0.0–0.7)
Eosinophils Relative: 2 %
Lymphocytes Relative: 25 %
Lymphs Abs: 2.2 10*3/uL (ref 0.7–4.0)
MONOS PCT: 9 %
Monocytes Absolute: 0.7 10*3/uL (ref 0.1–1.0)
Neutro Abs: 5.5 10*3/uL (ref 1.7–7.7)
Neutrophils Relative %: 64 %

## 2015-10-07 LAB — D-DIMER, QUANTITATIVE: D-Dimer, Quant: 0.43 ug/mL-FEU (ref 0.00–0.50)

## 2015-10-07 LAB — TROPONIN I: Troponin I: <0.03 ng/mL (ref <0.031)

## 2015-10-07 MED ORDER — KETOROLAC TROMETHAMINE 30 MG/ML IJ SOLN
30.0000 mg | Freq: Once | INTRAMUSCULAR | Status: AC
  Administered 2015-10-07: 30 mg via INTRAVENOUS
  Filled 2015-10-07: qty 1

## 2015-10-07 MED ORDER — ASPIRIN 81 MG PO CHEW
324.0000 mg | CHEWABLE_TABLET | Freq: Once | ORAL | Status: AC
  Administered 2015-10-07: 324 mg via ORAL
  Filled 2015-10-07: qty 4

## 2015-10-07 NOTE — ED Notes (Signed)
Pt states he had an mi 4 years ago, states he started having pain in his left chest 2 days ago that is worse with movement to the left.

## 2015-10-07 NOTE — ED Provider Notes (Signed)
CSN: [id number]     Arrival date & time 10/07/15  2254 History   By signing my name below, I, Chad Craig, attest that this documentation has been prepared under the direction and in the presence of Chad Boozeavid Sherwin Hollingshed, MD.  Electronically Signed: Arlan OrganAshley Craig, ED Scribe. 10/07/2015. 11:29 PM.   Chief Complaint  Patient presents with  . Chest Pain   The history is provided by the patient. No language interpreter was used.    HPI Comments: Chad Craig is a 19 y.o. male with a PMHx coronary vasospasm and MI who presents to the Emergency Department complaining of constant, ongoing chest pain onset 1:00 PM this afternoon, worsened this evening. Pain is described as cramping and rated 6/10 currently. Discomfort is made worse with deep breathing and mildly alleviated when drinking cold liquids. Pt states current pain does not feel similar to previous MI. Mr. Chad Craig also reports ongoing lower back pain x few days. No OTC medications or home remedies attempted prior to arrival. No recent fever, chills, nausea, vomiting, diaphoresis, or shortness of breath. No recent long distance travel. He denies any recent surgeries. Pt is a former smoker and quit May of 2015. PSHx includes L heart catheterization with coronary angiogram 03/08/2014.  PCP: Chad HectorNYLAND,[name], MD    Past Medical History  Diagnosis Date  . Coronary vasospasm     hx of MI at age 19 - no CAD at cath; ? myopericarditis vs spasm  . Hx of cardiac cath     a. LHC (03/08/14):  normal cors; EF 55% with subtle ant HK  . Hx of echocardiogram     a. 03/08/14: EF 50%, no WMA, mild MR, no effusion  . Heart attack     x3   Past Surgical History  Procedure Laterality Date  . None    . Nose surgery    . Left heart catheterization with coronary angiogram N/A 03/08/2014    Procedure: LEFT HEART CATHETERIZATION WITH CORONARY ANGIOGRAM;  Surgeon: Kathleene Hazelhristopher D McAlhany, MD;  [facility];  Service: Cardiovascular;  Laterality: N/A;   Family  History  Problem Relation Age of Onset  . Heart attack Maternal Grandfather    Social History  Substance Use Topics  . Smoking status: Former Smoker -- 1.00 packs/day for 5 years    Types: Cigarettes  . Smokeless tobacco: Not on file  . Alcohol Use: No    Review of Systems  Constitutional: Negative for fever, chills and diaphoresis.  Respiratory: Negative for cough and shortness of breath.   Cardiovascular: Positive for chest pain.  Gastrointestinal: Negative for nausea, vomiting and abdominal pain.  Musculoskeletal: Positive for back pain.  Neurological: Negative for weakness.  Psychiatric/Behavioral: Negative for confusion.  All other systems reviewed and are negative.     Allergies  Penicillins  Home Medications   Prior to Admission medications   Medication Sig Start Date End Date Taking? Authorizing Provider  amLODipine (NORVASC) 2.5 MG tablet Take 1 tablet (2.5 mg total) by mouth daily. 09/10/14   Eber HongBrian Miller, MD  HYDROcodone-acetaminophen (NORCO/VICODIN) 5-325 MG per tablet Take 1 tablet by mouth every 4 (four) hours as needed. 01/23/15   Hope Orlene OchM Neese, NP  hydrocortisone 2.5 % cream Apply topically 2 (two) times daily as needed (itching). 03/15/15   Mercedes Camprubi-Soms, PA-C  ibuprofen (ADVIL,MOTRIN) 800 MG tablet Take 1 tablet (800 mg total) by mouth 3 (three) times daily. Patient not taking: Reported on 09/10/2014 03/09/14   Beatrice LecherScott T Weaver, PA-C  isosorbide mononitrate (IMDUR) 30 MG 24 hr tablet Take 1.5 tablets (45 mg total) by mouth daily. 09/10/14   Eber Hong, MD  methocarbamol (ROBAXIN) 500 MG tablet Take 500 mg by mouth 3 (three) times daily.  08/25/14   Historical Provider, MD  naproxen (NAPROSYN) 500 MG tablet Take 1 tablet (500 mg total) by mouth 2 (two) times daily. 01/23/15   Hope Orlene Och, NP  nitroGLYCERIN (NITROSTAT) 0.4 MG SL tablet Place 1 tablet (0.4 mg total) under the tongue every 5 (five) minutes x 3 doses as needed for chest pain. 03/09/14   Beatrice Lecher, PA-C  pantoprazole (PROTONIX) 20 MG tablet Take 20 mg by mouth daily.  03/18/14 03/18/15  Historical Provider, MD  sulfamethoxazole-trimethoprim (SEPTRA DS) 800-160 MG per tablet Take 1 tablet by mouth 2 (two) times daily. Patient not taking: Reported on 09/10/2014 07/11/14   Bethann Berkshire, MD   Triage Vitals: BP 118/67 mmHg  Pulse 63  Temp(Src) 97.8 F (36.6 C) (Oral)  Resp 20  Ht 5' 7.5" (1.715 m)  Wt 135 lb (61.236 kg)  BMI 20.82 kg/m2  SpO2 100%   Physical Exam  Constitutional: He is oriented to person, place, and time. He appears well-developed and well-nourished.  HENT:  Head: Normocephalic and atraumatic.  L eye deviates medially   Eyes: EOM are normal.  Neck: Normal range of motion.  Cardiovascular: Normal rate, regular rhythm, normal heart sounds and intact distal pulses.  Exam reveals no friction rub.   Pulmonary/Chest: Effort normal and breath sounds normal. No respiratory distress.  Abdominal: Soft. He exhibits no distension. There is no tenderness.  Musculoskeletal: Normal range of motion.  Neurological: He is alert and oriented to person, place, and time.  Skin: Skin is warm and dry.  Psychiatric: He has a normal mood and affect. Judgment normal.  Nursing note and vitals reviewed.   ED Course  Procedures (including critical care time)  DIAGNOSTIC STUDIES: Oxygen Saturation is 100% on RA, Normal by my interpretation.    COORDINATION OF CARE: 10:56 PM- Will give ASA and Toradol.  Will order CXR, D-dimer, sedimentation rate, BMP, CBC, EKG, and troponin I. Discussed treatment plan with pt at bedside and pt agreed to plan.     Labs Review Labs Reviewed - No data to display  Results for orders placed or performed during the hospital encounter of 10/07/15  Basic metabolic panel  Result Value Ref Range   Sodium 139 135 - 145 mmol/L   Potassium 3.6 3.5 - 5.1 mmol/L   Chloride 100 (L) 101 - 111 mmol/L   CO2 27 22 - 32 mmol/L   Glucose, Bld 92 65 - 99 mg/dL    BUN 13 6 - 20 mg/dL   Creatinine, Ser 1.61 0.61 - 1.24 mg/dL   Calcium 8.8 (L) 8.9 - 10.3 mg/dL   GFR calc non Af Amer >60 >60 mL/min   GFR calc Af Amer >60 >60 mL/min   Anion gap 12 5 - 15  CBC  Result Value Ref Range   WBC 8.9 4.0 - 10.5 K/uL   RBC 4.92 4.22 - 5.81 MIL/uL   Hemoglobin 15.8 13.0 - 17.0 g/dL   HCT 09.6 04.5 - 40.9 %   MCV 94.1 78.0 - 100.0 fL   MCH 32.1 26.0 - 34.0 pg   MCHC 34.1 30.0 - 36.0 g/dL   RDW 81.1 91.4 - 78.2 %   Platelets 210 150 - 400 K/uL  Troponin I  Result Value Ref Range  Troponin I <0.03 <0.031 ng/mL  Differential  Result Value Ref Range   Neutrophils Relative % 64 %   Neutro Abs 5.5 1.7 - 7.7 K/uL   Lymphocytes Relative 25 %   Lymphs Abs 2.2 0.7 - 4.0 K/uL   Monocytes Relative 9 %   Monocytes Absolute 0.7 0.1 - 1.0 K/uL   Eosinophils Relative 2 %   Eosinophils Absolute 0.2 0.0 - 0.7 K/uL   Basophils Relative 0 %   Basophils Absolute 0.0 0.0 - 0.1 K/uL  Sedimentation rate  Result Value Ref Range   Sed Rate 2 0 - 16 mm/hr  D-dimer, quantitative  Result Value Ref Range   D-Dimer, Quant 0.43 0.00 - 0.50 ug/mL-FEU   Imaging Review Dg Chest 2 View  10/07/2015  CLINICAL DATA:  Left-sided chest pain tonight. Upper back pain for 2 days. Previous history of MI. EXAM: CHEST  2 VIEW COMPARISON:  05/28/2015 FINDINGS: The heart size and mediastinal contours are within normal limits. Both lungs are clear. The visualized skeletal structures are unremarkable. IMPRESSION: No active cardiopulmonary disease. Electronically Signed   By: Burman Nieves M.D.   On: 10/07/2015 23:38   I have personally reviewed and evaluated these images and lab results as part of my medical decision-making.   EKG Interpretation   Date/Time:  Wednesday October 07 2015 23:06:25 EST Ventricular Rate:  64 PR Interval:  106 QRS Duration: 88 QT Interval:  382 QTC Calculation: 394 R Axis:   82 Text Interpretation:  Sinus rhythm Short PR interval When compared with   ECG of 09/10/2014, No significant change was found Confirmed by [facility]  MD,  Natsuko Kelsay ([id number]) on 10/07/2015 11:11:59 PM      MDM   Final diagnoses:  Atypical chest pain    Chest pain of uncertain cause. Old records are reviewed and he does have history of having had an episode of chest pain with significant troponin elevation but catheterization showing normal coronary arteries. Differential diagnosis was felt to be pericarditis versus coronary spasm. He is given a dose of ketorolac in the ED and screening labs and x-rays obtained. Troponin is normal as is d-dimer and sedimentation rate. With normal sedimentation rate and normal ECG, I doubt significant pericarditis. Chest x-ray is also unremarkable. He had good relief of pain with ketorolac and he is discharged with instructions to use over-the-counter NSAIDs as needed.  I personally performed the services described in this documentation, which was scribed in my presence. The recorded information has been reviewed and is accurate.      Chad Booze, MD 10/08/15 [phone]

## 2015-10-08 LAB — SEDIMENTATION RATE: Sed Rate: 2 mm/hr (ref 0–16)

## 2015-10-08 NOTE — Discharge Instructions (Signed)
Take ibuprofen or naproxen as needed for pain.  Nonspecific Chest Pain  Chest pain can be caused by many different conditions. There is always a chance that your pain could be related to something serious, such as a heart attack or a blood clot in your lungs. Chest pain can also be caused by conditions that are not life-threatening. If you have chest pain, it is very important to follow up with your health care provider. CAUSES  Chest pain can be caused by:  Heartburn.  Pneumonia or bronchitis.  Anxiety or stress.  Inflammation around your heart (pericarditis) or lung (pleuritis or pleurisy).  A blood clot in your lung.  A collapsed lung (pneumothorax). It can develop suddenly on its own (spontaneous pneumothorax) or from trauma to the chest.  Shingles infection (varicella-zoster virus).  Heart attack.  Damage to the bones, muscles, and cartilage that make up your chest wall. This can include:  Bruised bones due to injury.  Strained muscles or cartilage due to frequent or repeated coughing or overwork.  Fracture to one or more ribs.  Sore cartilage due to inflammation (costochondritis). RISK FACTORS  Risk factors for chest pain may include:  Activities that increase your risk for trauma or injury to your chest.  Respiratory infections or conditions that cause frequent coughing.  Medical conditions or overeating that can cause heartburn.  Heart disease or family history of heart disease.  Conditions or health behaviors that increase your risk of developing a blood clot.  Having had chicken pox (varicella zoster). SIGNS AND SYMPTOMS Chest pain can feel like:  Burning or tingling on the surface of your chest or deep in your chest.  Crushing, pressure, aching, or squeezing pain.  Dull or sharp pain that is worse when you move, cough, or take a deep breath.  Pain that is also felt in your back, neck, shoulder, or arm, or pain that spreads to any of these areas. Your  chest pain may come and go, or it may stay constant. DIAGNOSIS Lab tests or other studies may be needed to find the cause of your pain. Your health care provider may have you take a test called an ambulatory ECG (electrocardiogram). An ECG records your heartbeat patterns at the time the test is performed. You may also have other tests, such as:  Transthoracic echocardiogram (TTE). During echocardiography, sound waves are used to create a picture of all of the heart structures and to look at how blood flows through your heart.  Transesophageal echocardiogram (TEE).This is a more advanced imaging test that obtains images from inside your body. It allows your health care provider to see your heart in finer detail.  Cardiac monitoring. This allows your health care provider to monitor your heart rate and rhythm in real time.  Holter monitor. This is a portable device that records your heartbeat and can help to diagnose abnormal heartbeats. It allows your health care provider to track your heart activity for several days, if needed.  Stress tests. These can be done through exercise or by taking medicine that makes your heart beat more quickly.  Blood tests.  Imaging tests. TREATMENT  Your treatment depends on what is causing your chest pain. Treatment may include:  Medicines. These may include:  Acid blockers for heartburn.  Anti-inflammatory medicine.  Pain medicine for inflammatory conditions.  Antibiotic medicine, if an infection is present.  Medicines to dissolve blood clots.  Medicines to treat coronary artery disease.  Supportive care for conditions that do not require  medicines. This may include:  Resting.  Applying heat or cold packs to injured areas.  Limiting activities until pain decreases. HOME CARE INSTRUCTIONS  If you were prescribed an antibiotic medicine, finish it all even if you start to feel better.  Avoid any activities that bring on chest pain.  Do not  use any tobacco products, including cigarettes, chewing tobacco, or electronic cigarettes. If you need help quitting, ask your health care provider.  Do not drink alcohol.  Take medicines only as directed by your health care provider.  Keep all follow-up visits as directed by your health care provider. This is important. This includes any further testing if your chest pain does not go away.  If heartburn is the cause for your chest pain, you may be told to keep your head raised (elevated) while sleeping. This reduces the chance that acid will go from your stomach into your esophagus.  Make lifestyle changes as directed by your health care provider. These may include:  Getting regular exercise. Ask your health care provider to suggest some activities that are safe for you.  Eating a heart-healthy diet. A registered dietitian can help you to learn healthy eating options.  Maintaining a healthy weight.  Managing diabetes, if necessary.  Reducing stress. SEEK MEDICAL CARE IF:  Your chest pain does not go away after treatment.  You have a rash with blisters on your chest.  You have a fever. SEEK IMMEDIATE MEDICAL CARE IF:   Your chest pain is worse.  You have an increasing cough, or you cough up blood.  You have severe abdominal pain.  You have severe weakness.  You faint.  You have chills.  You have sudden, unexplained chest discomfort.  You have sudden, unexplained discomfort in your arms, back, neck, or jaw.  You have shortness of breath at any time.  You suddenly start to sweat, or your skin gets clammy.  You feel nauseous or you vomit.  You suddenly feel light-headed or dizzy.  Your heart begins to beat quickly, or it feels like it is skipping beats. These symptoms may represent a serious problem that is an emergency. Do not wait to see if the symptoms will go away. Get medical help right away. Call your local emergency services (911 in the [place] Do not drive  yourself to the hospital.   This information is not intended to replace advice given to you by your health care provider. Make sure you discuss any questions you have with your health care provider.   Document Released: 07/20/2005 Document Revised: 10/31/2014 Document Reviewed: 05/16/2014 Elsevier Interactive Patient Education [employer]

## 2017-04-29 ENCOUNTER — Emergency Department (HOSPITAL_COMMUNITY)
Admission: EM | Admit: 2017-04-29 | Discharge: 2017-04-29 | Disposition: A | Discharge status: Home / Self Care | Payer: Self-pay | Attending: Emergency Medicine | Admitting: Emergency Medicine

## 2017-04-29 ENCOUNTER — Encounter (HOSPITAL_COMMUNITY): Payer: Self-pay

## 2017-04-29 DIAGNOSIS — Z87891 Personal history of nicotine dependence: Secondary | ICD-10-CM | POA: Insufficient documentation

## 2017-04-29 DIAGNOSIS — R112 Nausea with vomiting, unspecified: Secondary | ICD-10-CM | POA: Insufficient documentation

## 2017-04-29 DIAGNOSIS — Z79899 Other long term (current) drug therapy: Secondary | ICD-10-CM | POA: Insufficient documentation

## 2017-04-29 DIAGNOSIS — I201 Angina pectoris with documented spasm: Secondary | ICD-10-CM | POA: Insufficient documentation

## 2017-04-29 DIAGNOSIS — Z7901 Long term (current) use of anticoagulants: Secondary | ICD-10-CM | POA: Insufficient documentation

## 2017-04-29 DIAGNOSIS — I4891 Unspecified atrial fibrillation: Secondary | ICD-10-CM | POA: Insufficient documentation

## 2017-04-29 DIAGNOSIS — I252 Old myocardial infarction: Secondary | ICD-10-CM | POA: Insufficient documentation

## 2017-04-29 LAB — CBC
HCT: 43.7 % (ref 39.0–52.0)
Hemoglobin: 15.4 g/dL (ref 13.0–17.0)
MCH: 32 pg (ref 26.0–34.0)
MCHC: 35.2 g/dL (ref 30.0–36.0)
MCV: 90.9 fL (ref 78.0–100.0)
Platelets: 180 10*3/uL (ref 150–400)
RBC: 4.81 MIL/uL (ref 4.22–5.81)
RDW: 12 % (ref 11.5–15.5)
WBC: 6.7 10*3/uL (ref 4.0–10.5)

## 2017-04-29 LAB — URINALYSIS, ROUTINE W REFLEX MICROSCOPIC
Bacteria, UA: NONE SEEN
Bilirubin Urine: NEGATIVE
GLUCOSE, UA: NEGATIVE mg/dL
HGB URINE DIPSTICK: NEGATIVE
Ketones, ur: 5 mg/dL — AB
NITRITE: NEGATIVE
Protein, ur: 30 mg/dL — AB
SPECIFIC GRAVITY, URINE: 1.027 (ref 1.005–1.030)
pH: 5 (ref 5.0–8.0)

## 2017-04-29 LAB — COMPREHENSIVE METABOLIC PANEL
ALBUMIN: 4.3 g/dL (ref 3.5–5.0)
ALK PHOS: 57 U/L (ref 38–126)
ALT: 17 U/L (ref 17–63)
ANION GAP: 9 (ref 5–15)
AST: 25 U/L (ref 15–41)
BILIRUBIN TOTAL: 1.2 mg/dL (ref 0.3–1.2)
BUN: 13 mg/dL (ref 6–20)
CALCIUM: 9.1 mg/dL (ref 8.9–10.3)
CO2: 26 mmol/L (ref 22–32)
Chloride: 106 mmol/L (ref 101–111)
Creatinine, Ser: 0.86 mg/dL (ref 0.61–1.24)
GFR calc Af Amer: >60 mL/min (ref >60)
GFR calc non Af Amer: >60 mL/min (ref >60)
GLUCOSE: 82 mg/dL (ref 65–99)
Potassium: 3.7 mmol/L (ref 3.5–5.1)
Sodium: 141 mmol/L (ref 135–145)
TOTAL PROTEIN: 7.3 g/dL (ref 6.5–8.1)

## 2017-04-29 LAB — TROPONIN I: Troponin I: <0.03 ng/mL (ref <0.03)

## 2017-04-29 LAB — LIPASE, BLOOD: LIPASE: 20 U/L (ref 11–51)

## 2017-04-29 MED ORDER — ONDANSETRON HCL 4 MG/2ML IJ SOLN
INTRAMUSCULAR | Status: AC
  Administered 2017-04-29: 4 mg
  Filled 2017-04-29: qty 2

## 2017-04-29 MED ORDER — PROMETHAZINE HCL 12.5 MG PO TABS: 12.5000 mg | ORAL_TABLET | Freq: Four times a day (QID) | ORAL | 0 refills | Status: DC | PRN

## 2017-04-29 MED ORDER — SODIUM CHLORIDE 0.9 % IV BOLUS (SEPSIS)
1000.0000 mL | Freq: Once | INTRAVENOUS | Status: AC
  Administered 2017-04-29: 1000 mL via INTRAVENOUS

## 2017-04-29 NOTE — ED Provider Notes (Signed)
[facility]-EMERGENCY DEPT Provider Note   CSN: [id number] Arrival date & time: 04/29/17  1754     History   Chief Complaint Chief Complaint  Patient presents with  . Emesis    HPI Chad Craig is a 21 y.o. male.  HPI Chad Craig is a 21 y.o. male with history of atrial fibrillation, coronary vasospasms, presents to emergency department complaining of abdominal pain, nausea, vomiting. Patient states his symptoms began this morning. He states his pain is in the right side of the abdomen. He states he cannot keep anything down. He reports his mother gave him a "nerve pill" which did not help. He denies any diarrhea. Last bowel movement yesterday, normal. Denies any urinary symptoms. No fever or chills. He states he has been under a lot of stress recently. His brother passed away from a drug overdose 4 days ago. He does admit drinking alcohol then. Denies drugs. Denies recent alcohol. Denies any sick contacts. He does state he has had multiple heart problems, but states this does not feel like when he has had heart issues.  Past Medical History:  Diagnosis Date  . Coronary vasospasm    hx of MI at age 56 - no CAD at cath; ? myopericarditis vs spasm  . Heart attack (HCC)    x3  . Hx of cardiac cath    a. LHC (03/08/14):  normal cors; EF 55% with subtle ant HK  . Hx of echocardiogram    a. 03/08/14: EF 50%, no WMA, mild MR, no effusion    Patient Active Problem List   Diagnosis Date Noted  . Chest pain 02/23/2014    Past Surgical History:  Procedure Laterality Date  . LEFT HEART CATHETERIZATION WITH CORONARY ANGIOGRAM N/A 03/08/2014   Procedure: LEFT HEART CATHETERIZATION WITH CORONARY ANGIOGRAM;  Surgeon: Kathleene Hazel, MD;  Location: [facility] CATH LAB;  Service: Cardiovascular;  Laterality: N/A;  . None    . NOSE SURGERY         Home Medications    Prior to Admission medications   Medication Sig Start Date End Date Taking? Authorizing Provider  amLODipine (NORVASC) 2.5 MG  tablet Take 1 tablet (2.5 mg total) by mouth daily. 09/10/14   Eber Hong, MD  HYDROcodone-acetaminophen (NORCO/VICODIN) 5-325 MG per tablet Take 1 tablet by mouth every 4 (four) hours as needed. 01/23/15   Janne Napoleon, NP  hydrocortisone 2.5 % cream Apply topically 2 (two) times daily as needed (itching). 03/15/15   Street, Mercedes, PA-C  ibuprofen (ADVIL,MOTRIN) 800 MG tablet Take 1 tablet (800 mg total) by mouth 3 (three) times daily. Patient not taking: Reported on 09/10/2014 03/09/14   Tereso Newcomer T, PA-C  isosorbide mononitrate (IMDUR) 30 MG 24 hr tablet Take 1.5 tablets (45 mg total) by mouth daily. 09/10/14   Eber Hong, MD  methocarbamol (ROBAXIN) 500 MG tablet Take 500 mg by mouth 3 (three) times daily.  08/25/14   [provider]  naproxen (NAPROSYN) 500 MG tablet Take 1 tablet (500 mg total) by mouth 2 (two) times daily. 01/23/15   Janne Napoleon, NP  nitroGLYCERIN (NITROSTAT) 0.4 MG SL tablet Place 1 tablet (0.4 mg total) under the tongue every 5 (five) minutes x 3 doses as needed for chest pain. 03/09/14   Tereso Newcomer T, PA-C  pantoprazole (PROTONIX) 20 MG tablet Take 20 mg by mouth daily.  03/18/14 03/18/15  [provider]  sulfamethoxazole-trimethoprim (SEPTRA DS) 800-160 MG per tablet Take 1 tablet by mouth  2 (two) times daily. Patient not taking: Reported on 09/10/2014 07/11/14   Bethann BerkshireZammit, Joseph, MD    Family History Family History  Problem Relation Age of Onset  . Heart attack Maternal Grandfather     Social History Social History  Substance Use Topics  . Smoking status: Former Smoker    Packs/day: 1.00    Years: 5.00    Types: Cigarettes  . Smokeless tobacco: Never Used  . Alcohol use Yes     Comment: beer weekly     Allergies   Penicillins   Review of Systems Review of Systems  Constitutional: Negative for chills and fever.  Respiratory: Negative for cough, chest tightness and shortness of breath.   Cardiovascular: Negative for chest  pain, palpitations and leg swelling.  Gastrointestinal: Positive for abdominal pain, nausea and vomiting. Negative for abdominal distention and diarrhea.  Genitourinary: Negative for dysuria, frequency, hematuria and urgency.  Musculoskeletal: Negative for arthralgias, myalgias, neck pain and neck stiffness.  Skin: Negative for rash.  Allergic/Immunologic: Negative for immunocompromised state.  Neurological: Negative for dizziness, weakness, light-headedness, numbness and headaches.  All other systems reviewed and are negative.    Physical Exam Updated Vital Signs BP 112/69 (BP Location: Right Arm)   Pulse (!) 57   Temp 98.3 F (36.8 C) (Oral)   Resp 18   Ht 5\' 7"  (1.702 m)   Wt 62.1 kg (137 lb)   SpO2 94%   BMI 21.46 kg/m   Physical Exam  Constitutional: He appears well-developed and well-nourished. No distress.  HENT:  Head: Normocephalic and atraumatic.  Eyes: Conjunctivae are normal.  Neck: Neck supple.  Cardiovascular: Normal rate, regular rhythm and normal heart sounds.   Pulmonary/Chest: Effort normal. No respiratory distress. He has no wheezes. He has no rales.  Abdominal: Soft. Bowel sounds are normal. He exhibits no distension. There is tenderness. There is no rebound.  RUQ tenderness  Musculoskeletal: He exhibits no edema.  Neurological: He is alert.  Skin: Skin is warm and dry.  Nursing note and vitals reviewed.    ED Treatments / Results  Labs (all labs ordered are listed, but only abnormal results are displayed) Labs Reviewed  LIPASE, BLOOD  COMPREHENSIVE METABOLIC PANEL  CBC  TROPONIN I  URINALYSIS, ROUTINE W REFLEX MICROSCOPIC    EKG  EKG Interpretation  Date/Time:  Saturday April 29 2017 18:38:59 EDT Ventricular Rate:  53 PR Interval:  112 QRS Duration: 84 QT Interval:  422 QTC Calculation: 395 R Axis:   72 Text Interpretation:  Sinus bradycardia with marked sinus arrhythmia Otherwise normal ECG since last tracing no significant change  Confirmed by Eber HongMiller, Brian ([id number]) on 04/29/2017 6:44:41 PM       Radiology No results found.  Procedures Procedures (including critical care time)  Medications Ordered in ED Medications  ondansetron (ZOFRAN) 4 MG/2ML injection (4 mg  Given 04/29/17 1823)     Initial Impression / Assessment and Plan / ED Course  I have reviewed the triage vital signs and the nursing notes.  Pertinent labs & imaging results that were available during my care of the patient were reviewed by me and considered in my medical decision making (see chart for details).     Patient in emergency department with abdominal pain, nausea, vomiting. Pain is in the right rapid quadrant. Abdomen is soft, no surgical abdomen at this time. He is afebrile, nontoxic appearing otherwise. Regular heart rate and rhythm. Will check EKG given history of A. fib. Troponin given history of vasospasms.  Will check basic labs, administer IV fluids and Zofran.   7:31 PM Labs negative. Pt feeling better. Nausea improved. UA pending. Will give another fluid bolus, urine appears dark.   7:59 PM Patient is drinking water, states nausea has improved, not vomiting. Abdomen is soft, feels improved as well. Plan to discharge home with antiemetics. Follow-up as needed.  Vitals:   04/29/17 1801 04/29/17 1939  BP: 112/69 (!) 101/58  Pulse: (!) 57 67  Resp: 18 18  Temp: 98.3 F (36.8 C) 98 F (36.7 C)  TempSrc: Oral Oral  SpO2: 94% 100%  Weight: 62.1 kg (137 lb)   Height: 5\' 7"  (1.702 m)      Final Clinical Impressions(s) / ED Diagnoses   Final diagnoses:  Non-intractable vomiting with nausea, unspecified vomiting type    New Prescriptions Discharge Medication List as of 04/29/2017  8:02 PM    START taking these medications   Details  promethazine (PHENERGAN) 12.5 MG tablet Take 1 tablet (12.5 mg total) by mouth every 6 (six) hours as needed for nausea or vomiting., Starting Sat 04/29/2017, Print         Shawnta Zimbelman,  [place], PA-C 04/30/17 0050    Eber Hong, MD 04/30/17 [phone]

## 2017-04-29 NOTE — Discharge Instructions (Signed)
Take phenergan as prescribed as needed for nausea and vomiting. Drink plenty of fluids. Advance diet as tolerate. Follow up with family doctor as needed. Return if worsening symptoms.

## 2017-04-29 NOTE — ED Notes (Signed)
Sipping pos

## 2017-04-29 NOTE — ED Triage Notes (Signed)
Abdominal pain with nausea and vomiting that started today.

## 2017-04-29 NOTE — ED Notes (Signed)
Pt reports that his brother overdosed and died last weekend and it has been difficult for him Denies eating any food that he thought was bad, having body aches or viral sx, also deneis chest pain although he [facility] having had 5 previous MIs due to his coronary spasms and a fib

## 2017-04-30 ENCOUNTER — Other Ambulatory Visit (HOSPITAL_COMMUNITY): Payer: Self-pay | Admitting: Emergency Medicine

## 2017-04-30 ENCOUNTER — Ambulatory Visit (HOSPITAL_COMMUNITY)
Admission: RE | Admit: 2017-04-30 | Discharge: 2017-04-30 | Disposition: A | Discharge status: Home / Self Care | Payer: Self-pay | Source: Ambulatory Visit | Attending: Emergency Medicine | Admitting: Emergency Medicine

## 2017-04-30 DIAGNOSIS — N200 Calculus of kidney: Secondary | ICD-10-CM | POA: Insufficient documentation

## 2017-04-30 DIAGNOSIS — R112 Nausea with vomiting, unspecified: Secondary | ICD-10-CM | POA: Insufficient documentation

## 2017-04-30 DIAGNOSIS — R1031 Right lower quadrant pain: Secondary | ICD-10-CM | POA: Insufficient documentation

## 2017-08-13 ENCOUNTER — Emergency Department (HOSPITAL_COMMUNITY): Payer: Self-pay

## 2017-08-13 ENCOUNTER — Emergency Department (HOSPITAL_COMMUNITY)
Admission: EM | Admit: 2017-08-13 | Discharge: 2017-08-13 | Disposition: A | Discharge status: Home / Self Care | Payer: Self-pay | Attending: Emergency Medicine | Admitting: Emergency Medicine

## 2017-08-13 ENCOUNTER — Encounter (HOSPITAL_COMMUNITY): Payer: Self-pay | Admitting: Emergency Medicine

## 2017-08-13 DIAGNOSIS — Z0489 Encounter for examination and observation for other specified reasons: Secondary | ICD-10-CM | POA: Insufficient documentation

## 2017-08-13 DIAGNOSIS — Z87891 Personal history of nicotine dependence: Secondary | ICD-10-CM | POA: Insufficient documentation

## 2017-08-13 DIAGNOSIS — Z79899 Other long term (current) drug therapy: Secondary | ICD-10-CM | POA: Insufficient documentation

## 2017-08-13 DIAGNOSIS — I252 Old myocardial infarction: Secondary | ICD-10-CM | POA: Insufficient documentation

## 2017-08-13 HISTORY — DX: Other specified cardiac arrhythmias: I49.8

## 2017-08-13 NOTE — ED Notes (Signed)
IV d/c'd to right forearm, catheter intact. bandaid applied to site.
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
## 2018-10-21 ENCOUNTER — Other Ambulatory Visit: Payer: Self-pay

## 2018-10-21 ENCOUNTER — Encounter (HOSPITAL_COMMUNITY): Payer: Self-pay | Admitting: Emergency Medicine

## 2018-10-21 ENCOUNTER — Emergency Department (HOSPITAL_COMMUNITY)
Admission: EM | Admit: 2018-10-21 | Discharge: 2018-10-21 | Disposition: A | Discharge status: Home / Self Care | Payer: Self-pay | Attending: Emergency Medicine | Admitting: Emergency Medicine

## 2018-10-21 DIAGNOSIS — Z87891 Personal history of nicotine dependence: Secondary | ICD-10-CM | POA: Insufficient documentation

## 2018-10-21 DIAGNOSIS — B349 Viral infection, unspecified: Secondary | ICD-10-CM

## 2018-10-21 DIAGNOSIS — Z79899 Other long term (current) drug therapy: Secondary | ICD-10-CM | POA: Insufficient documentation

## 2018-10-21 MED ORDER — PROMETHAZINE-DM 6.25-15 MG/5ML PO SYRP: 5.0000 mL | ORAL_SOLUTION | Freq: Four times a day (QID) | ORAL | 0 refills | Status: DC | PRN

## 2018-10-21 MED ORDER — OSELTAMIVIR PHOSPHATE 75 MG PO CAPS: 75.0000 mg | ORAL_CAPSULE | Freq: Two times a day (BID) | ORAL | 0 refills | Status: DC

## 2018-10-21 NOTE — Discharge Instructions (Addendum)
It is important to rest and drink plenty of fluids.  Take Tylenol every 4 hours for body aches and fever.  Take the Tamiflu as directed until its finished.  Follow-up with your primary doctor for recheck if needed.
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
## 2018-10-21 NOTE — ED Notes (Signed)
No flu shot this year  Smoker   Here with flu like sx

## 2018-10-21 NOTE — ED Provider Notes (Signed)
[facility] EMERGENCY DEPARTMENT Provider Note   CSN: [id number] Arrival date & time: 10/21/18  1340     History   Chief Complaint Chief Complaint  Patient presents with  . Influenza    HPI Chad Craig is a 22 y.o. male.  HPI   Chad Craig is a 22 y.o. male past medical history of atrial arrhythmia and coronary vasospasm.  He presents to the Emergency Department complaining of nasal congestion, cough, and generalized body aches with sweats and chills.  Symptoms began yesterday.  States cough is been productive of green phlegm.  He is unsure of fever at home but does endorse frequent sweating and shaking chills.  He has been drinking fluids without difficulty.  He endorses tightness of his chest associated with cough only.  No wheezing.  He denies abdominal pain diarrhea or vomiting.  Possible sick contacts and states he is concerned that he has the flu.  He contacted his PCP and was instructed to come to the emergency room for evaluation given his cardiac history.  He has been taking Mucinex with no relief.   Past Medical History:  Diagnosis Date  . Atrial arrhythmia   . Coronary vasospasm    hx of MI at age 72 - no CAD at cath; ? myopericarditis vs spasm  . Heart attack (HCC)    x3  . Hx of cardiac cath    a. LHC (03/08/14):  normal cors; EF 55% with subtle ant HK  . Hx of echocardiogram    a. 03/08/14: EF 50%, no WMA, mild MR, no effusion    Patient Active Problem List   Diagnosis Date Noted  . Chest pain 02/23/2014    Past Surgical History:  Procedure Laterality Date  . LEFT HEART CATHETERIZATION WITH CORONARY ANGIOGRAM N/A 03/08/2014   Procedure: LEFT HEART CATHETERIZATION WITH CORONARY ANGIOGRAM;  Surgeon: Kathleene Hazel, MD;  Location: [facility] CATH LAB;  Service: Cardiovascular;  Laterality: N/A;  . None    . NOSE SURGERY       Home Medications    Prior to Admission medications   Medication Sig Start Date End Date Taking? Authorizing Provider    amLODipine (NORVASC) 2.5 MG tablet Take 1 tablet (2.5 mg total) by mouth daily. 09/10/14   Eber Hong, MD  HYDROcodone-acetaminophen (NORCO/VICODIN) 5-325 MG per tablet Take 1 tablet by mouth every 4 (four) hours as needed. 01/23/15   Janne Napoleon, NP  hydrocortisone 2.5 % cream Apply topically 2 (two) times daily as needed (itching). 03/15/15   Street, Mercedes, PA-C  ibuprofen (ADVIL,MOTRIN) 800 MG tablet Take 1 tablet (800 mg total) by mouth 3 (three) times daily. Patient not taking: Reported on 09/10/2014 03/09/14   Tereso Newcomer T, PA-C  isosorbide mononitrate (IMDUR) 30 MG 24 hr tablet Take 1.5 tablets (45 mg total) by mouth daily. 09/10/14   Eber Hong, MD  methocarbamol (ROBAXIN) 500 MG tablet Take 500 mg by mouth 3 (three) times daily.  08/25/14   [provider]  naproxen (NAPROSYN) 500 MG tablet Take 1 tablet (500 mg total) by mouth 2 (two) times daily. 01/23/15   Janne Napoleon, NP  nitroGLYCERIN (NITROSTAT) 0.4 MG SL tablet Place 1 tablet (0.4 mg total) under the tongue every 5 (five) minutes x 3 doses as needed for chest pain. 03/09/14   Tereso Newcomer T, PA-C  pantoprazole (PROTONIX) 20 MG tablet Take 20 mg by mouth daily.  03/18/14 03/18/15  [provider]  promethazine (PHENERGAN) 12.5 MG  tablet Take 1 tablet (12.5 mg total) by mouth every 6 (six) hours as needed for nausea or vomiting. 04/29/17   Kirichenko, Tatyana, PA-C  sulfamethoxazole-trimethoprim (SEPTRA DS) 800-160 MG per tablet Take 1 tablet by mouth 2 (two) times daily. Patient not taking: Reported on 09/10/2014 07/11/14   Bethann BerkshireZammit, Joseph, MD    Family History Family History  Problem Relation Age of Onset  . Heart attack Maternal Grandfather     Social History Social History   Tobacco Use  . Smoking status: Former Smoker    Packs/day: 1.00    Years: 5.00    Pack years: 5.00    Types: Cigarettes  . Smokeless tobacco: Never Used  Substance Use Topics  . Alcohol use: Yes    Comment: beer weekly  .  Drug use: Yes    Types: Marijuana     Allergies   Penicillins and Motrin [ibuprofen]   Review of Systems Review of Systems  Constitutional: Negative for activity change, appetite change, chills and fever.  HENT: Positive for congestion. Negative for facial swelling, rhinorrhea, sore throat and trouble swallowing.   Eyes: Negative for visual disturbance.  Respiratory: Positive for cough. Negative for chest tightness, shortness of breath, wheezing and stridor.   Cardiovascular: Negative for chest pain.  Gastrointestinal: Negative for abdominal pain, nausea and vomiting.  Genitourinary: Negative for decreased urine volume and dysuria.  Musculoskeletal: Positive for myalgias (Generalized body aches). Negative for neck pain and neck stiffness.  Skin: Negative for rash.  Neurological: Negative for dizziness, weakness, numbness and headaches.  Hematological: Negative for adenopathy.  Psychiatric/Behavioral: Negative for confusion.     Physical Exam Updated Vital Signs BP 106/64 (BP Location: Right Arm)   Pulse 86   Temp 98.3 F (36.8 C) (Oral)   Resp 18   Ht 5\' 7"  (1.702 m)   Wt 59.9 kg   SpO2 95%   BMI 20.67 kg/m   Physical Exam Vitals signs and nursing note reviewed.  Constitutional:      General: He is not in acute distress.    Appearance: Normal appearance. He is not ill-appearing.  HENT:     Head: Normocephalic.     Right Ear: Tympanic membrane and ear canal normal.     Left Ear: Tympanic membrane and ear canal normal.     Mouth/Throat:     Mouth: Mucous membranes are moist.     Pharynx: Oropharynx is clear. No oropharyngeal exudate or posterior oropharyngeal erythema.  Neck:     Musculoskeletal: Normal range of motion.  Cardiovascular:     Rate and Rhythm: Normal rate and regular rhythm.     Pulses: Normal pulses.  Pulmonary:     Effort: Pulmonary effort is normal.     Breath sounds: Normal breath sounds. No wheezing or rales.  Abdominal:     General: There  is no distension.     Palpations: Abdomen is soft.     Tenderness: There is no abdominal tenderness. There is no guarding.  Musculoskeletal: Normal range of motion.  Lymphadenopathy:     Cervical: No cervical adenopathy.  Skin:    General: Skin is warm.     Findings: No rash.  Neurological:     General: No focal deficit present.     Mental Status: He is alert.     Sensory: No sensory deficit.      ED Treatments / Results  Labs (all labs ordered are listed, but only abnormal results are displayed) Labs Reviewed - No data to  display  EKG None  Radiology No results found.  Procedures Procedures (including critical care time)  Medications Ordered in ED Medications - No data to display   Initial Impression / Assessment and Plan / ED Course  I have reviewed the triage vital signs and the nursing notes.  Pertinent labs & imaging results that were available during my care of the patient were reviewed by me and considered in my medical decision making (see chart for details).     Patient is well-appearing nontoxic.  Vital signs reassuring.  Lungs are clear on exam.  Symptoms are likely related to viral process.  Patient reassured.  He agrees to symptomatic treatment, will start on Tamiflu.  He agrees to close follow-up with his PCP and return precautions were discussed.  Final Clinical Impressions(s) / ED Diagnoses   Final diagnoses:  Viral illness    ED Discharge Orders    None       Pauline Ausriplett, Gerardine Peltz, PA-C 10/21/18 1550    Vanetta MuldersZackowski, Scott, MD 10/22/18 [phone]

## 2020-08-12 ENCOUNTER — Emergency Department (HOSPITAL_COMMUNITY): Payer: Self-pay

## 2020-08-12 ENCOUNTER — Emergency Department (HOSPITAL_COMMUNITY)
Admission: EM | Admit: 2020-08-12 | Discharge: 2020-08-12 | Disposition: A | Discharge status: Home / Self Care | Payer: Self-pay | Attending: Emergency Medicine | Admitting: Emergency Medicine

## 2020-08-12 ENCOUNTER — Encounter (HOSPITAL_COMMUNITY): Payer: Self-pay

## 2020-08-12 ENCOUNTER — Other Ambulatory Visit: Payer: Self-pay

## 2020-08-12 DIAGNOSIS — Z20822 Contact with and (suspected) exposure to covid-19: Secondary | ICD-10-CM | POA: Insufficient documentation

## 2020-08-12 DIAGNOSIS — Z79899 Other long term (current) drug therapy: Secondary | ICD-10-CM | POA: Insufficient documentation

## 2020-08-12 DIAGNOSIS — R112 Nausea with vomiting, unspecified: Secondary | ICD-10-CM

## 2020-08-12 DIAGNOSIS — R1011 Right upper quadrant pain: Secondary | ICD-10-CM

## 2020-08-12 DIAGNOSIS — F17210 Nicotine dependence, cigarettes, uncomplicated: Secondary | ICD-10-CM | POA: Insufficient documentation

## 2020-08-12 DIAGNOSIS — R197 Diarrhea, unspecified: Secondary | ICD-10-CM | POA: Insufficient documentation

## 2020-08-12 LAB — COMPREHENSIVE METABOLIC PANEL
ALT: 17 U/L (ref 0–44)
AST: 21 U/L (ref 15–41)
Albumin: 4 g/dL (ref 3.5–5.0)
Alkaline Phosphatase: 49 U/L (ref 38–126)
Anion gap: 8 (ref 5–15)
BUN: 17 mg/dL (ref 6–20)
CO2: 24 mmol/L (ref 22–32)
Calcium: 8.2 mg/dL — ABNORMAL LOW (ref 8.9–10.3)
Chloride: 108 mmol/L (ref 98–111)
Creatinine, Ser: 0.79 mg/dL (ref 0.61–1.24)
GFR, Estimated: >60 mL/min (ref >60)
Glucose, Bld: 111 mg/dL — ABNORMAL HIGH (ref 70–99)
Potassium: 4.3 mmol/L (ref 3.5–5.1)
Sodium: 140 mmol/L (ref 135–145)
Total Bilirubin: 0.7 mg/dL (ref 0.3–1.2)
Total Protein: 6.8 g/dL (ref 6.5–8.1)

## 2020-08-12 LAB — CBC WITH DIFFERENTIAL/PLATELET
Abs Immature Granulocytes: 0.1 10*3/uL — ABNORMAL HIGH (ref 0.00–0.07)
Basophils Absolute: 0 10*3/uL (ref 0.0–0.1)
Basophils Relative: 0 %
Eosinophils Absolute: 0.2 10*3/uL (ref 0.0–0.5)
Eosinophils Relative: 1 %
HCT: 47.2 % (ref 39.0–52.0)
Hemoglobin: 15.9 g/dL (ref 13.0–17.0)
Immature Granulocytes: 1 %
Lymphocytes Relative: 2 %
Lymphs Abs: 0.4 10*3/uL — ABNORMAL LOW (ref 0.7–4.0)
MCH: 31.5 pg (ref 26.0–34.0)
MCHC: 33.7 g/dL (ref 30.0–36.0)
MCV: 93.7 fL (ref 80.0–100.0)
Monocytes Absolute: 1.1 10*3/uL — ABNORMAL HIGH (ref 0.1–1.0)
Monocytes Relative: 6 %
Neutro Abs: 16.2 10*3/uL — ABNORMAL HIGH (ref 1.7–7.7)
Neutrophils Relative %: 90 %
Platelets: 191 10*3/uL (ref 150–400)
RBC: 5.04 MIL/uL (ref 4.22–5.81)
RDW: 11.7 % (ref 11.5–15.5)
WBC: 18.1 10*3/uL — ABNORMAL HIGH (ref 4.0–10.5)
nRBC: 0 % (ref 0.0–0.2)

## 2020-08-12 LAB — RESPIRATORY PANEL BY RT PCR (FLU A&B, COVID)
Influenza A by PCR: NEGATIVE
Influenza B by PCR: NEGATIVE
SARS Coronavirus 2 by RT PCR: NEGATIVE

## 2020-08-12 LAB — LIPASE, BLOOD: Lipase: 39 U/L (ref 11–51)

## 2020-08-12 MED ORDER — METOCLOPRAMIDE HCL 5 MG/ML IJ SOLN
10.0000 mg | Freq: Once | INTRAMUSCULAR | Status: AC
  Administered 2020-08-12: 10 mg via INTRAVENOUS
  Filled 2020-08-12: qty 2

## 2020-08-12 MED ORDER — SODIUM CHLORIDE 0.9 % IV BOLUS
1000.0000 mL | Freq: Once | INTRAVENOUS | Status: AC
  Administered 2020-08-12: 1000 mL via INTRAVENOUS

## 2020-08-12 MED ORDER — IOHEXOL 300 MG/ML  SOLN: 100.0000 mL | Freq: Once | INTRAMUSCULAR | Status: DC | PRN

## 2020-08-12 NOTE — ED Provider Notes (Signed)
[facility] EMERGENCY DEPARTMENT Provider Note   CSN: [id number] Arrival date & time: 08/12/20  1256     History Chief Complaint  Patient presents with  . Emesis    Chad Craig is a 24 y.o. male.  HPI      Chad Craig is a 24 y.o. male who presents to the Emergency Department complaining of sudden onset of right-sided abdominal pain and vomiting that began this morning.  He states that he was at work around 8:30 AM this morning when he developed crampy type pain of his right abdomen and shortly after began vomiting.  He reports multiple times of vomiting yellow fluid.  Nonbloody or black.  He denies diarrhea or fever but endorses shaking chills since onset of vomiting. Denies chest pain, shortness of breath.  Denies known Covid exposures or household members who are currently ill.  Patient is not vaccinated against COVID-19.  He denies flank pain or dysuria.  No history of abdominal surgeries.   Past Medical History:  Diagnosis Date  . Atrial arrhythmia   . Coronary vasospasm    hx of MI at age 62 - no CAD at cath; ? myopericarditis vs spasm  . Heart attack (HCC)    x3  . Hx of cardiac cath    a. LHC (03/08/14):  normal cors; EF 55% with subtle ant HK  . Hx of echocardiogram    a. 03/08/14: EF 50%, no WMA, mild MR, no effusion    Patient Active Problem List   Diagnosis Date Noted  . Chest pain 02/23/2014    Past Surgical History:  Procedure Laterality Date  . LEFT HEART CATHETERIZATION WITH CORONARY ANGIOGRAM N/A 03/08/2014   Procedure: LEFT HEART CATHETERIZATION WITH CORONARY ANGIOGRAM;  Surgeon: Kathleene Hazel, MD;  Location: [facility] CATH LAB;  Service: Cardiovascular;  Laterality: N/A;  . None    . NOSE SURGERY         Family History  Problem Relation Age of Onset  . Heart attack Maternal Grandfather     Social History   Tobacco Use  . Smoking status: Current Every Day Smoker    Packs/day: 1.00    Years: 5.00    Pack years: 5.00    Types:  Cigarettes  . Smokeless tobacco: Never Used  Substance Use Topics  . Alcohol use: Not Currently  . Drug use: Not Currently    Home Medications Prior to Admission medications   Medication Sig Start Date End Date Taking? Authorizing Provider  amLODipine (NORVASC) 2.5 MG tablet Take 1 tablet (2.5 mg total) by mouth daily. 09/10/14   Eber Hong, MD  HYDROcodone-acetaminophen (NORCO/VICODIN) 5-325 MG per tablet Take 1 tablet by mouth every 4 (four) hours as needed. 01/23/15   Janne Napoleon, NP  hydrocortisone 2.5 % cream Apply topically 2 (two) times daily as needed (itching). 03/15/15   Street, Mercedes, PA-C  ibuprofen (ADVIL,MOTRIN) 800 MG tablet Take 1 tablet (800 mg total) by mouth 3 (three) times daily. Patient not taking: Reported on 09/10/2014 03/09/14   Tereso Newcomer T, PA-C  isosorbide mononitrate (IMDUR) 30 MG 24 hr tablet Take 1.5 tablets (45 mg total) by mouth daily. 09/10/14   Eber Hong, MD  methocarbamol (ROBAXIN) 500 MG tablet Take 500 mg by mouth 3 (three) times daily.  08/25/14   [provider]  naproxen (NAPROSYN) 500 MG tablet Take 1 tablet (500 mg total) by mouth 2 (two) times daily. 01/23/15   Janne Napoleon, NP  nitroGLYCERIN (NITROSTAT)  0.4 MG SL tablet Place 1 tablet (0.4 mg total) under the tongue every 5 (five) minutes x 3 doses as needed for chest pain. 03/09/14   Tereso Newcomer T, PA-C  oseltamivir (TAMIFLU) 75 MG capsule Take 1 capsule (75 mg total) by mouth 2 (two) times daily. 10/21/18   Mak Bonny, PA-C  pantoprazole (PROTONIX) 20 MG tablet Take 20 mg by mouth daily.  03/18/14 03/18/15  [provider]  promethazine (PHENERGAN) 12.5 MG tablet Take 1 tablet (12.5 mg total) by mouth every 6 (six) hours as needed for nausea or vomiting. 04/29/17   Kirichenko, Lemont Fillers, PA-C  promethazine-dextromethorphan (PROMETHAZINE-DM) 6.25-15 MG/5ML syrup Take 5 mLs by mouth 4 (four) times daily as needed for cough. 10/21/18   Jissel Slavens, PA-C    sulfamethoxazole-trimethoprim (SEPTRA DS) 800-160 MG per tablet Take 1 tablet by mouth 2 (two) times daily. Patient not taking: Reported on 09/10/2014 07/11/14   Bethann Berkshire, MD    Allergies    Penicillins and Motrin [ibuprofen]  Review of Systems   Review of Systems  Constitutional: Positive for chills. Negative for appetite change and fever.  Respiratory: Negative for chest tightness and shortness of breath.   Cardiovascular: Negative for chest pain.  Gastrointestinal: Positive for abdominal pain, nausea and vomiting. Negative for abdominal distention, blood in stool and diarrhea.  Genitourinary: Negative for decreased urine volume, difficulty urinating, dysuria and flank pain.  Musculoskeletal: Negative for back pain.  Skin: Negative for color change and rash.  Neurological: Negative for dizziness, weakness and numbness.  Hematological: Negative for adenopathy.    Physical Exam Updated Vital Signs BP 103/68 (BP Location: Right Arm)   Pulse 65   Temp 98.5 F (36.9 C) (Oral)   Resp 18   Ht 5\' 8"  (1.727 m)   Wt 58.1 kg   SpO2 99%   BMI 19.46 kg/m   Physical Exam Vitals and nursing note reviewed.  Constitutional:      General: He is not in acute distress.    Appearance: Normal appearance. He is well-developed. He is not toxic-appearing or diaphoretic.  HENT:     Head: Normocephalic and atraumatic.     Mouth/Throat:     Mouth: Mucous membranes are dry.  Eyes:     Conjunctiva/sclera: Conjunctivae normal.  Cardiovascular:     Rate and Rhythm: Normal rate and regular rhythm.     Pulses: Normal pulses.  Pulmonary:     Effort: Pulmonary effort is normal. No respiratory distress.     Breath sounds: Normal breath sounds.  Abdominal:     General: Bowel sounds are normal. There is no distension.     Palpations: Abdomen is soft. There is no mass.     Tenderness: There is abdominal tenderness. There is no right CVA tenderness, left CVA tenderness, guarding or rebound.      Comments: ttp of the right mid abdomen.  No guarding or rebound tenderness.  Musculoskeletal:        General: Normal range of motion.     Right lower leg: No edema.     Left lower leg: No edema.  Skin:    General: Skin is warm.     Capillary Refill: Capillary refill takes less than 2 seconds.     Findings: No rash.  Neurological:     General: No focal deficit present.     Mental Status: He is alert.     Motor: No weakness or abnormal muscle tone.     Coordination: Coordination normal.  ED Results / Procedures / Treatments   Labs (all labs ordered are listed, but only abnormal results are displayed) Labs Reviewed  COMPREHENSIVE METABOLIC PANEL - Abnormal; Notable for the following components:      Result Value   Glucose, Bld 111 (*)    Calcium 8.2 (*)    All other components within normal limits  CBC WITH DIFFERENTIAL/PLATELET - Abnormal; Notable for the following components:   WBC 18.1 (*)    Neutro Abs 16.2 (*)    Lymphs Abs 0.4 (*)    Monocytes Absolute 1.1 (*)    Abs Immature Granulocytes 0.10 (*)    All other components within normal limits  RESPIRATORY PANEL BY RT PCR (FLU A&B, COVID)  LIPASE, BLOOD    EKG EKG Interpretation  Date/Time:  Wednesday August 12 2020 13:35:06 EDT Ventricular Rate:  62 PR Interval:  114 QRS Duration: 86 QT Interval:  386 QTC Calculation: 391 R Axis:   79 Text Interpretation: Normal sinus rhythm with sinus arrhythmia Normal ECG No significant change since prior 10/18 Confirmed by Meridee Score [phone]) on 08/12/2020 1:45:20 PM   Radiology No results found.  Procedures Procedures (including critical care time)  Medications Ordered in ED Medications  sodium chloride 0.9 % bolus 1,000 mL (1,000 mLs Intravenous New Bag/Given 08/12/20 1335)    ED Course  I have reviewed the triage vital signs and the nursing notes.  Pertinent labs & imaging results that were available during my care of the patient were reviewed by me and  considered in my medical decision making (see chart for details).    MDM Rules/Calculators/A&P                          Patient here with right-sided abdominal pain nausea and vomiting beginning earlier this morning.  Patient has right mid ~upper abdominal pain on exam.  At this point, differential diagnosis would include acute appendicitis.  Patient has been given IV Zofran and IV fluids by EMS prior to arrival.  He is nontoxic-appearing, no active vomiting.  Will obtain labs and CT of the abdomen pelvis.  1525 I was notified by nursing staff that patient eloped, left the dept prior to completion of his evaluation.    Final Clinical Impression(s) / ED Diagnoses Final diagnoses:  Right upper quadrant abdominal pain  Nausea vomiting and diarrhea    Rx / DC Orders ED Discharge Orders    None       Pauline Aus, PA-C 08/13/20 1036    Terrilee Files, MD 08/13/20 1247

## 2020-08-12 NOTE — ED Notes (Signed)
Pt walked out of his room, looks at this RN says "duces" and walks out of the er, family accompanied pt from dpt. Pt left before evaluation complete, pt iv was found in the bedding with the cath intact.  PA notified.

## 2020-08-12 NOTE — ED Triage Notes (Signed)
Pt to er via ems, per ems pt is here for abd pain and vomiting, pt states that he was at work around 830am and started vomiting, states that he went home and continued to feel poorly, ems placed an iv and gave 4 of zofran.  Pt resps even and unlabored.

## 2021-07-15 ENCOUNTER — Other Ambulatory Visit: Payer: Self-pay

## 2021-07-15 ENCOUNTER — Encounter: Payer: Self-pay | Admitting: Emergency Medicine

## 2021-07-15 ENCOUNTER — Ambulatory Visit
Admission: EM | Admit: 2021-07-15 | Discharge: 2021-07-15 | Disposition: A | Discharge status: Home / Self Care | Payer: Medicaid Other | Attending: Emergency Medicine | Admitting: Emergency Medicine

## 2021-07-15 DIAGNOSIS — K130 Diseases of lips: Secondary | ICD-10-CM

## 2021-07-15 MED ORDER — DOXYCYCLINE HYCLATE 100 MG PO CAPS: 100.0000 mg | ORAL_CAPSULE | Freq: Two times a day (BID) | ORAL | 0 refills | Status: DC

## 2021-07-15 NOTE — Discharge Instructions (Signed)
Apply warm compresses 3-4x daily for 10-15 minutes Wash site daily with warm water and mild soap Keep covered to avoid friction Take antibiotic as prescribed and to completion Follow up here or with PCP if symptoms persists Return or go to the ED if you have any new or worsening symptoms increased redness, swelling, pain, nausea, vomiting, fever, chills, etc...  

## 2021-07-15 NOTE — ED Triage Notes (Signed)
Swollen lip since Tuesday  States wife tried to pop a white head and now lip is swollen.

## 2021-07-15 NOTE — ED Provider Notes (Signed)
[facility] CARE CENTER   [id number] 07/15/21 Arrival Time: 1545   [id number]  SUBJECTIVE:  Chad Craig is a 25 y.o. male who presents with a possible abscess of his LT lower lip that occurred a few days ago. Reports white head, and wife tried popping.  Sore to the touch.  No drainage or bleeding.  Denies alleviating factors.  Sore to the touch.  Reports similar symptoms in the past.  Denies fever, chills, nausea, vomiting, dysphagia, dyspnea.    ROS: As per HPI.  All other pertinent ROS negative.     Past Medical History:  Diagnosis Date   Atrial arrhythmia    Coronary vasospasm    hx of MI at age 73 - no CAD at cath; ? myopericarditis vs spasm   Heart attack (HCC)    x3   Hx of cardiac cath    a. LHC (03/08/14):  normal cors; EF 55% with subtle ant HK   Hx of echocardiogram    a. 03/08/14: EF 50%, no WMA, mild MR, no effusion   Past Surgical History:  Procedure Laterality Date   LEFT HEART CATHETERIZATION WITH CORONARY ANGIOGRAM N/A 03/08/2014   Procedure: LEFT HEART CATHETERIZATION WITH CORONARY ANGIOGRAM;  Surgeon: Kathleene Hazel, MD;  Location: [facility] CATH LAB;  Service: Cardiovascular;  Laterality: N/A;   None     NOSE SURGERY     Allergies  Allergen Reactions   Penicillins Hives   Motrin [Ibuprofen] Rash   No current facility-administered medications on file prior to encounter.   Current Outpatient Medications on File Prior to Encounter  Medication Sig Dispense Refill   pantoprazole (PROTONIX) 20 MG tablet Take 20 mg by mouth daily.      Social History   Socioeconomic History   Marital status: Single    Spouse name: Not on file   Number of children: Not on file   Years of education: Not on file   Highest education level: Not on file  Occupational History   Not on file  Tobacco Use   Smoking status: Every Day    Packs/day: 1.00    Years: 5.00    Pack years: 5.00    Types: Cigarettes   Smokeless tobacco: Never  Substance and Sexual Activity    Alcohol use: Not Currently   Drug use: Not Currently   Sexual activity: Yes    Birth control/protection: None  Other Topics Concern   Not on file  Social History Narrative   Not on file   Social Determinants of Health   Financial Resource Strain: Not on file  Food Insecurity: Not on file  Transportation Needs: Not on file  Physical Activity: Not on file  Stress: Not on file  Social Connections: Not on file  Intimate Partner Violence: Not on file   Family History  Problem Relation Age of Onset   Heart attack Maternal Grandfather     OBJECTIVE:  Vitals:   07/15/21 1553  BP: 124/72  Pulse: 66  Resp: 18  Temp: 98 F (36.7 C)  TempSrc: Oral  SpO2: 95%    General appearance: alert; no distress Skin: 1-2 cm induration of his LT lower lip; tender to touch; no active drainage Psychological: alert and cooperative; normal mood and affect  ASSESSMENT & PLAN:  1. Abscess, lip    Meds ordered this encounter  Medications   doxycycline (VIBRAMYCIN) 100 MG capsule    Sig: Take 1 capsule (100 mg total) by mouth 2 (two) times daily.  Dispense:  20 capsule    Refill:  0    Order Specific Question:   Supervising Provider    Answer:   Eustace Moore [[id number]]   Apply warm compresses 3-4x daily for 10-15 minutes Wash site daily with warm water and mild soap Keep covered to avoid friction Take antibiotic as prescribed and to completion Follow up here or with PCP if symptoms persists Return or go to the ED if you have any new or worsening symptoms increased redness, swelling, pain, nausea, vomiting, fever, chills, etc...    Reviewed expectations re: course of current medical issues. Questions answered. Outlined signs and symptoms indicating need for more acute intervention. Patient verbalized understanding. After Visit Summary given.           Rennis Harding, PA-C 07/15/21 1612

## 2021-08-11 ENCOUNTER — Ambulatory Visit (INDEPENDENT_AMBULATORY_CARE_PROVIDER_SITE_OTHER): Payer: Self-pay | Admitting: Orthopaedic Surgery

## 2021-08-11 ENCOUNTER — Other Ambulatory Visit: Payer: Self-pay

## 2021-08-11 ENCOUNTER — Encounter: Payer: Self-pay | Admitting: Orthopaedic Surgery

## 2021-08-11 DIAGNOSIS — M25572 Pain in left ankle and joints of left foot: Secondary | ICD-10-CM

## 2021-08-11 NOTE — Progress Notes (Signed)
Office Visit Note   Patient: Chad Craig           Date of Birth: Mar 13, 1996           MRN: [id number] Visit Date: 08/11/2021              Requested by: No referring provider defined for this encounter. PCP: Pcp, No   Assessment & Plan: Visit Diagnoses:  1. Pain in left ankle and joints of left foot     Plan: Proximately 10 days ago, stepped in a hole and twisted his left ankle.  He had immediate onset of pain and was seen at the [facility] emergency room.  Films were negative.  He was placed in an Aircast splint and crutches and asked to follow-up in the office.  He does work in [employer] but has not been able to work since his injury as he is having pain with any weightbearing.  I did review his films and they were negative for any acute changes.  The ankle mortise was intact.  He was having some pain along the deltoid ligament but no significant swelling and some discomfort behind the medial malleolus.  He could have had a strain of the posterior tibial tendon but its function is intact.  I am going to place him in a equalizer boot.  He can use the crutches if necessary.  Continue with over-the-counter medicines.  No work until he is seen in 2 weeks  Follow-Up Instructions: Return in about 2 weeks (around 08/25/2021).   Orders:  No orders of the defined types were placed in this encounter.  No orders of the defined types were placed in this encounter.     Procedures: No procedures performed   Clinical Data: No additional findings.   Subjective: Chief Complaint  Patient presents with   Left Ankle - Pain, Injury    DOI 08/01/2021  Patient presents today for his left ankle. He states that he stepped into a hole and rolled his ankle on 08/01/2021. The following morning his ankle was worse, so he went to [facility] ED. He had x-rays that day and was given an air cast and crutches to use. He is still unable to bear weight. His pain is located medially and  laterally. He has some swelling. He is taking Tylenol for pain. 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   HPI  Review of Systems   Objective: Vital Signs: There were no vitals taken for this visit.  Physical Exam Constitutional:      Appearance: He is well-developed.  Eyes:     Pupils: Pupils are equal, round, and reactive to light.  Pulmonary:     Effort: Pulmonary effort is normal.  Skin:    General: Skin is warm and dry.  Neurological:     Mental Status: He is alert and oriented to person, place, and time.  Psychiatric:        Behavior: Behavior normal.    Ortho Exam awake alert and oriented x3.  Comfortable sitting.  Ambulates with crutches.  He has an Aircast of the left ankle which was removed.  Left foot and ankle examined.  There is no erythema or ecchymosis or significant swelling.  There was some mild tenderness behind the medial malleolus and very minimal discomfort over the deltoid ligament.  No midfoot or forefoot pain.  Achilles intact.  No plantar discomfort.  Posterior tibial tendon function intact.  Neurologically intact.  Good capillary refill to toes  Specialty Comments:  No specialty comments available.  Imaging: No results found.   PMFS History: Patient Active Problem List   Diagnosis Date Noted   Pain in left ankle and joints of left foot 08/11/2021   Chest pain 02/23/2014   Past Medical History:  Diagnosis Date   Atrial arrhythmia    Coronary vasospasm    hx of MI at age 79 - no CAD at cath; ? myopericarditis vs spasm   Heart attack (HCC)    x3   Hx of cardiac cath    a. LHC (03/08/14):  normal cors; EF 55% with subtle ant HK   Hx of echocardiogram    a. 03/08/14: EF 50%, no WMA, mild MR, no effusion    Family History  Problem Relation Age of Onset   Heart attack Maternal Grandfather     Past Surgical History:  Procedure Laterality Date   LEFT HEART CATHETERIZATION WITH CORONARY ANGIOGRAM N/A 03/08/2014    Procedure: LEFT HEART CATHETERIZATION WITH CORONARY ANGIOGRAM;  Surgeon: Kathleene Hazel, MD;  Location: [facility] CATH LAB;  Service: Cardiovascular;  Laterality: N/A;   None     NOSE SURGERY     Social History   Occupational History   Not on file  Tobacco Use   Smoking status: Every Day    Packs/day: 1.00    Years: 5.00    Pack years: 5.00    Types: Cigarettes   Smokeless tobacco: Never  Substance and Sexual Activity   Alcohol use: Not Currently   Drug use: Not Currently   Sexual activity: Yes    Birth control/protection: None

## 2021-08-25 ENCOUNTER — Encounter: Payer: Self-pay | Admitting: Orthopaedic Surgery

## 2021-08-25 ENCOUNTER — Ambulatory Visit (INDEPENDENT_AMBULATORY_CARE_PROVIDER_SITE_OTHER): Payer: Self-pay | Admitting: Orthopaedic Surgery

## 2021-08-25 ENCOUNTER — Other Ambulatory Visit: Payer: Self-pay

## 2021-08-25 DIAGNOSIS — M25572 Pain in left ankle and joints of left foot: Secondary | ICD-10-CM

## 2021-08-25 NOTE — Progress Notes (Signed)
Office Visit Note   Patient: Chad Craig           Date of Birth: Apr 30, 1996           MRN: [id number] Visit Date: 08/25/2021              Requested by: No referring provider defined for this encounter. PCP: Pcp, No   Assessment & Plan: Visit Diagnoses: No diagnosis found.  Plan: Patient is now 3 weeks out status post left ankle sprain after stepping in a hole.  He has been immobilized in a cam walker boot and he reports he is doing better and has been able to fully weight-bear with the boot on.  He says the swelling is gone down.  His only complaint is still of pain with turning his foot out and pain over the lateral ankle.  I did print for him a ankle rehab program.  He is going to start coming out of his boot as tolerated we discussed he should still do this at home.  I think he can go back to work in 2 weeks as long as he is comfortable with this.  He will follow-up in 4 weeks but if he is doing well he may cancel this appointment  Follow-Up Instructions: No follow-ups on file.   Orders:  No orders of the defined types were placed in this encounter.  No orders of the defined types were placed in this encounter.     Procedures: No procedures performed   Clinical Data: No additional findings.   Subjective: Chief Complaint  Patient presents with   Left Ankle - Pain  Patient presents today for follow up on his left ankle. He has been wearing his boot all the time except for showering and sleeping. He is walking better, but unable to fully weight bear.     Review of Systems  All other systems reviewed and are negative.   Objective: Vital Signs: There were no vitals taken for this visit.  Patient is alert appropriate to exam appears comfortable normal respiratory effort  Ortho Exam Examination of his left ankle he has no swelling no redness skin is intact.  He has good resisted dorsiflexion plantarflexion inversion and eversion of the eversion is slightly weak and  painful nontender today over the posterior medial ankle.  He does still have some tenderness over the ATFL pulses are easily palpable Specialty Comments:  No specialty comments available.  Imaging: No results found.   PMFS History: Patient Active Problem List   Diagnosis Date Noted   Pain in left ankle and joints of left foot 08/11/2021   Chest pain 02/23/2014   Past Medical History:  Diagnosis Date   Atrial arrhythmia    Coronary vasospasm    hx of MI at age 55 - no CAD at cath; ? myopericarditis vs spasm   Heart attack (HCC)    x3   Hx of cardiac cath    a. LHC (03/08/14):  normal cors; EF 55% with subtle ant HK   Hx of echocardiogram    a. 03/08/14: EF 50%, no WMA, mild MR, no effusion    Family History  Problem Relation Age of Onset   Heart attack Maternal Grandfather     Past Surgical History:  Procedure Laterality Date   LEFT HEART CATHETERIZATION WITH CORONARY ANGIOGRAM N/A 03/08/2014   Procedure: LEFT HEART CATHETERIZATION WITH CORONARY ANGIOGRAM;  Surgeon: Kathleene Hazel, MD;  Location: [facility] CATH LAB;  Service: Cardiovascular;  Laterality: N/A;   None     NOSE SURGERY     Social History   Occupational History   Not on file  Tobacco Use   Smoking status: Every Day    Packs/day: 1.00    Years: 5.00    Pack years: 5.00    Types: Cigarettes   Smokeless tobacco: Never  Substance and Sexual Activity   Alcohol use: Not Currently   Drug use: Not Currently   Sexual activity: Yes    Birth control/protection: None

## 2021-09-22 ENCOUNTER — Ambulatory Visit (INDEPENDENT_AMBULATORY_CARE_PROVIDER_SITE_OTHER): Payer: Self-pay | Admitting: Orthopaedic Surgery

## 2021-09-22 ENCOUNTER — Other Ambulatory Visit: Payer: Self-pay

## 2021-09-22 ENCOUNTER — Encounter: Payer: Self-pay | Admitting: Orthopaedic Surgery

## 2021-09-22 VITALS — Ht 68.0 in | Wt 128.0 lb

## 2021-09-22 DIAGNOSIS — M25572 Pain in left ankle and joints of left foot: Secondary | ICD-10-CM

## 2021-09-22 NOTE — Progress Notes (Signed)
Office Visit Note   Patient: Chad Craig           Date of Birth: 1995/10/28           MRN: [id number] Visit Date: 09/22/2021              Requested by: No referring provider defined for this encounter. PCP: Pcp, No   Assessment & Plan: Visit Diagnoses:  1. Pain in left ankle and joints of left foot     Plan: 6 weeks s/p injury to left ankle consistent with an ankle sprain.  Initially wore a cam walker and and has transition to the point where he is comfortable working.  He returned to work last week.  Still having some tenderness along the posterior tibial tendon and near the attachment at the navicular which is prominent.  There is no ankle swelling.  We will apply an ASO ankle support and have him return as needed.  If he continues to have a problem I like to see him back but there is no evidence of instability.  Might have some mild posterior tibial tendinitis which hopefully will resolve over time and wearing the splint when he is at work  Follow-Up Instructions: Return if symptoms worsen or fail to improve.   Orders:  No orders of the defined types were placed in this encounter.  No orders of the defined types were placed in this encounter.     Procedures: No procedures performed   Clinical Data: No additional findings.   Subjective: Chief Complaint  Patient presents with   Left Ankle - Follow-up    DOI 08/01/2021  Patient returns for four week follow up left ankle. He states that he is doing much better.  He is no longer wearing the boot and is not having to take anything for pain.  HPI  Review of Systems   Objective: Vital Signs: Ht 5\' 8"  (1.727 m)   Wt 128 lb (58.1 kg)   BMI 19.46 kg/m   Physical Exam Constitutional:      Appearance: He is well-developed.  Eyes:     Pupils: Pupils are equal, round, and reactive to light.  Pulmonary:     Effort: Pulmonary effort is normal.  Skin:    General: Skin is warm and dry.  Neurological:     Mental  Status: He is alert and oriented to person, place, and time.  Psychiatric:        Behavior: Behavior normal.    Ortho Exam left ankle was not hot red warm or swollen.  There is no tenderness laterally over the anterior talofibular ligament of the fibulocalcaneal ligament.  No pain along the peroneal tendons.  Neurovascular exam intact.  Does have pes planus.  Posterior tib tendon appears to be intact.  There is a little bit of tenderness along the tendon near its attachment to the prominent navicular.  No plantar pain.  Motor exam intact.  No swelling or erythema medially.  No pain over the deltoid ligament  Specialty Comments:  No specialty comments available.  Imaging: No results found.   PMFS History: Patient Active Problem List   Diagnosis Date Noted   Pain in left ankle and joints of left foot 08/11/2021   Chest pain 02/23/2014   Past Medical History:  Diagnosis Date   Atrial arrhythmia    Coronary vasospasm    hx of MI at age 89 - no CAD at cath; ? myopericarditis vs spasm   Heart attack (  HCC)    x3   Hx of cardiac cath    a. LHC (03/08/14):  normal cors; EF 55% with subtle ant HK   Hx of echocardiogram    a. 03/08/14: EF 50%, no WMA, mild MR, no effusion    Family History  Problem Relation Age of Onset   Heart attack Maternal Grandfather     Past Surgical History:  Procedure Laterality Date   LEFT HEART CATHETERIZATION WITH CORONARY ANGIOGRAM N/A 03/08/2014   Procedure: LEFT HEART CATHETERIZATION WITH CORONARY ANGIOGRAM;  Surgeon: Kathleene Hazel, MD;  Location: [facility] CATH LAB;  Service: Cardiovascular;  Laterality: N/A;   None     NOSE SURGERY     Social History   Occupational History   Not on file  Tobacco Use   Smoking status: Every Day    Packs/day: 1.00    Years: 5.00    Pack years: 5.00    Types: Cigarettes   Smokeless tobacco: Never  Substance and Sexual Activity   Alcohol use: Not Currently   Drug use: Not Currently   Sexual activity: Yes     Birth control/protection: None

## 2021-10-12 ENCOUNTER — Encounter: Payer: Self-pay | Admitting: Emergency Medicine

## 2021-10-12 ENCOUNTER — Other Ambulatory Visit: Payer: Self-pay

## 2021-10-12 ENCOUNTER — Ambulatory Visit
Admission: EM | Admit: 2021-10-12 | Discharge: 2021-10-12 | Disposition: A | Discharge status: Home / Self Care | Payer: Self-pay | Attending: Student | Admitting: Student

## 2021-10-12 DIAGNOSIS — J069 Acute upper respiratory infection, unspecified: Secondary | ICD-10-CM

## 2021-10-12 DIAGNOSIS — Z20822 Contact with and (suspected) exposure to covid-19: Secondary | ICD-10-CM

## 2021-10-12 MED ORDER — LIDOCAINE VISCOUS HCL 2 % MT SOLN: 15.0000 mL | OROMUCOSAL | 0 refills | Status: AC | PRN

## 2021-10-12 NOTE — Discharge Instructions (Addendum)
-  For sore throat, use lidocaine mouthwash up to every 4 hours. Make sure not to eat for at least 1 hour after using this, as your mouth will be very numb and you could bite yourself. -Continue over-the-counter medications like Mucinex, Nyquil, etc.  -You can take Tylenol up to 1000 mg 3 times daily, and ibuprofen up to 600 mg 3 times daily with food.  You can take these together, or alternate every 3-4 hours. -With a virus, you're typically contagious for 5-7 days, or as long as you're having fevers.

## 2021-10-12 NOTE — ED Provider Notes (Signed)
[facility]    CSN: [id number] Arrival date & time: 10/12/21  1007      History   Chief Complaint No chief complaint on file.   HPI Chad Craig is a 25 y.o. male.  Presenting with viral syndrome for 1 day.  Medical history arrhythmia as below.  Here today with son who has similar symptoms.  Describes 3 days of cough, nasal congestion, sore throat.  Cough is nonproductive.  Temperature normal range at home.  Tolerating fluids and food.  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  HPI  Past Medical History:  Diagnosis Date   Atrial arrhythmia    Coronary vasospasm    hx of MI at age 71 - no CAD at cath; ? myopericarditis vs spasm   Heart attack ([place])    x3   Hx of cardiac cath    a. LHC (03/08/14):  normal cors; EF 55% with subtle ant HK   Hx of echocardiogram    a. 03/08/14: EF 50%, no WMA, mild MR, no effusion    Patient Active Problem List   Diagnosis Date Noted   Pain in left ankle and joints of left foot 08/11/2021   Chest pain 02/23/2014    Past Surgical History:  Procedure Laterality Date   LEFT HEART CATHETERIZATION WITH CORONARY ANGIOGRAM N/A 03/08/2014   Procedure: LEFT HEART CATHETERIZATION WITH CORONARY ANGIOGRAM;  Surgeon: Burnell Blanks, MD;  Location: [facility] CATH LAB;  Service: Cardiovascular;  Laterality: N/A;   None     NOSE SURGERY         Home Medications    Prior to Admission medications   Medication Sig Start Date End Date Taking? Authorizing Provider  lidocaine (XYLOCAINE) 2 % solution Use as directed 15 mLs in the mouth or throat every 4 (four) hours as needed for mouth pain. 10/12/21  Yes Hazel Sams, PA-C    Family History Family History  Problem Relation Age of Onset   Heart attack Maternal Grandfather     Social History Social History   Tobacco Use   Smoking status: Every Day    Packs/day: 1.00    Years: 5.00    Pack years: 5.00    Types: Cigarettes   Smokeless tobacco: Never   Substance Use Topics   Alcohol use: Not Currently   Drug use: Not Currently     Allergies   Penicillins and Motrin [ibuprofen]   Review of Systems Review of Systems  Constitutional:  Negative for appetite change, chills and fever.  HENT:  Positive for congestion. Negative for ear pain, rhinorrhea, sinus pressure, sinus pain and sore throat.   Eyes:  Negative for redness and visual disturbance.  Respiratory:  Positive for cough. Negative for chest tightness, shortness of breath and wheezing.   Cardiovascular:  Negative for chest pain and palpitations.  Gastrointestinal:  Negative for abdominal pain, constipation, diarrhea, nausea and vomiting.  Genitourinary:  Negative for dysuria, frequency and urgency.  Musculoskeletal:  Negative for myalgias.  Neurological:  Negative for dizziness, weakness and headaches.  Psychiatric/Behavioral:  Negative for confusion.   All other systems reviewed and are negative.   Physical Exam Triage Vital Signs ED Triage Vitals  Enc Vitals Group     BP 10/12/21 1136 100/61     Pulse Rate 10/12/21 1136 74     Resp 10/12/21 1136 18     Temp 10/12/21 1136 97.9 F (36.6 C)     Temp  Source 10/12/21 1136 Oral     SpO2 10/12/21 1136 93 %     Weight --      Height --      Head Circumference --      Peak Flow --      Pain Score 10/12/21 1137 0     Pain Loc --      Pain Edu? --      Excl. in [place]? --    No data found.  Updated Vital Signs BP 100/61 (BP Location: Right Arm)    Pulse 74    Temp 97.9 F (36.6 C) (Oral)    Resp 18    SpO2 93%   Visual Acuity Right Eye Distance:   Left Eye Distance:   Bilateral Distance:    Right Eye Near:   Left Eye Near:    Bilateral Near:     Physical Exam Vitals reviewed.  Constitutional:      General: He is not in acute distress.    Appearance: Normal appearance. He is not ill-appearing.  HENT:     Head: Normocephalic and atraumatic.     Right Ear: Tympanic membrane, ear canal and external ear normal.  No tenderness. No middle ear effusion. There is no impacted cerumen. Tympanic membrane is not perforated, erythematous, retracted or bulging.     Left Ear: Tympanic membrane, ear canal and external ear normal. No tenderness.  No middle ear effusion. There is no impacted cerumen. Tympanic membrane is not perforated, erythematous, retracted or bulging.     Nose: Nose normal. No congestion.     Mouth/Throat:     Mouth: Mucous membranes are moist.     Pharynx: Uvula midline. No oropharyngeal exudate or posterior oropharyngeal erythema.  Eyes:     Extraocular Movements: Extraocular movements intact.     Pupils: Pupils are equal, round, and reactive to light.  Cardiovascular:     Rate and Rhythm: Normal rate and regular rhythm.     Heart sounds: Normal heart sounds.  Pulmonary:     Effort: Pulmonary effort is normal.     Breath sounds: Normal breath sounds. No decreased breath sounds, wheezing, rhonchi or rales.  Abdominal:     Palpations: Abdomen is soft.     Tenderness: There is no abdominal tenderness. There is no guarding or rebound.  Lymphadenopathy:     Cervical: No cervical adenopathy.     Right cervical: No superficial cervical adenopathy.    Left cervical: No superficial cervical adenopathy.  Neurological:     General: No focal deficit present.     Mental Status: He is alert and oriented to person, place, and time.  Psychiatric:        Mood and Affect: Mood normal.        Behavior: Behavior normal.        Thought Content: Thought content normal.        Judgment: Judgment normal.     UC Treatments / Results  Labs (all labs ordered are listed, but only abnormal results are displayed) Labs Reviewed  COVID-19, FLU A+B NAA    EKG   Radiology No results found.  Procedures Procedures (including critical care time)  Medications Ordered in UC Medications - No data to display  Initial Impression / Assessment and Plan / UC Course  I have reviewed the triage vital signs  and the nursing notes.  Pertinent labs & imaging results that were available during my care of the patient were reviewed by me and considered  in my medical decision making (see chart for details).     This patient is a very pleasant 25 y.o. year old male presenting with viral Uri with cough. Today this pt is afebrile nontachycardic nontachypneic, oxygenating well on room air, no wheezes rhonchi or rales.   Covid and influenza PCR sent. Given mild symptoms would defer tamiflu.   ED return precautions discussed. Patient verbalizes understanding and agreement.   Work note provided.   Final Clinical Impressions(s) / UC Diagnoses   Final diagnoses:  Exposure to COVID-19 virus  Viral URI with cough     Discharge Instructions      -For sore throat, use lidocaine mouthwash up to every 4 hours. Make sure not to eat for at least 1 hour after using this, as your mouth will be very numb and you could bite yourself. -Continue over-the-counter medications like Mucinex, Nyquil, etc.  -You can take Tylenol up to 1000 mg 3 times daily, and ibuprofen up to 600 mg 3 times daily with food.  You can take these together, or alternate every 3-4 hours. -With a virus, you're typically contagious for 5-7 days, or as long as you're having fevers.     ED Prescriptions     Medication Sig Dispense Auth. Provider   lidocaine (XYLOCAINE) 2 % solution Use as directed 15 mLs in the mouth or throat every 4 (four) hours as needed for mouth pain. 100 mL Rhys Martini, PA-C      PDMP not reviewed this encounter.   Rhys Martini, PA-C 10/12/21 1257

## 2021-10-12 NOTE — ED Triage Notes (Signed)
Cough, runny nose, sore throat since yesterday

## 2021-10-13 LAB — COVID-19, FLU A+B NAA
Influenza A, NAA: NOT DETECTED
Influenza B, NAA: NOT DETECTED
SARS-CoV-2, NAA: NOT DETECTED

## 2021-11-04 ENCOUNTER — Ambulatory Visit (INDEPENDENT_AMBULATORY_CARE_PROVIDER_SITE_OTHER): Payer: 59

## 2021-11-04 ENCOUNTER — Other Ambulatory Visit: Payer: Self-pay

## 2021-11-04 ENCOUNTER — Emergency Department (HOSPITAL_COMMUNITY)
Admission: EM | Admit: 2021-11-04 | Discharge: 2021-11-04 | Discharge status: Against Medical Advice | Payer: 59 | Attending: Emergency Medicine | Admitting: Emergency Medicine

## 2021-11-04 ENCOUNTER — Encounter (HOSPITAL_COMMUNITY): Payer: Self-pay

## 2021-11-04 ENCOUNTER — Emergency Department (HOSPITAL_COMMUNITY): Payer: 59

## 2021-11-04 ENCOUNTER — Ambulatory Visit
Admission: EM | Admit: 2021-11-04 | Discharge: 2021-11-04 | Disposition: A | Discharge status: Home / Self Care | Payer: 59 | Attending: Urgent Care | Admitting: Urgent Care

## 2021-11-04 DIAGNOSIS — Z5321 Procedure and treatment not carried out due to patient leaving prior to being seen by health care provider: Secondary | ICD-10-CM | POA: Insufficient documentation

## 2021-11-04 DIAGNOSIS — F1210 Cannabis abuse, uncomplicated: Secondary | ICD-10-CM

## 2021-11-04 DIAGNOSIS — F439 Reaction to severe stress, unspecified: Secondary | ICD-10-CM | POA: Diagnosis not present

## 2021-11-04 DIAGNOSIS — F17200 Nicotine dependence, unspecified, uncomplicated: Secondary | ICD-10-CM

## 2021-11-04 DIAGNOSIS — R079 Chest pain, unspecified: Secondary | ICD-10-CM

## 2021-11-04 DIAGNOSIS — Z8679 Personal history of other diseases of the circulatory system: Secondary | ICD-10-CM

## 2021-11-04 DIAGNOSIS — R69 Illness, unspecified: Secondary | ICD-10-CM | POA: Diagnosis not present

## 2021-11-04 DIAGNOSIS — R0789 Other chest pain: Secondary | ICD-10-CM

## 2021-11-04 LAB — CBC
HCT: 47.7 % (ref 39.0–52.0)
Hemoglobin: 16.2 g/dL (ref 13.0–17.0)
MCH: 32.4 pg (ref 26.0–34.0)
MCHC: 34 g/dL (ref 30.0–36.0)
MCV: 95.4 fL (ref 80.0–100.0)
Platelets: 230 10*3/uL (ref 150–400)
RBC: 5 MIL/uL (ref 4.22–5.81)
RDW: 12.7 % (ref 11.5–15.5)
WBC: 8.8 10*3/uL (ref 4.0–10.5)
nRBC: 0 % (ref 0.0–0.2)

## 2021-11-04 LAB — BASIC METABOLIC PANEL
Anion gap: 9 (ref 5–15)
BUN: 9 mg/dL (ref 6–20)
CO2: 26 mmol/L (ref 22–32)
Calcium: 9.2 mg/dL (ref 8.9–10.3)
Chloride: 105 mmol/L (ref 98–111)
Creatinine, Ser: 0.8 mg/dL (ref 0.61–1.24)
GFR, Estimated: >60 mL/min (ref >60)
Glucose, Bld: 94 mg/dL (ref 70–99)
Potassium: 4.2 mmol/L (ref 3.5–5.1)
Sodium: 140 mmol/L (ref 135–145)

## 2021-11-04 LAB — TROPONIN I (HIGH SENSITIVITY)
Troponin I (High Sensitivity): 3 ng/L (ref <18)
Troponin I (High Sensitivity): 3 ng/L (ref <18)

## 2021-11-04 NOTE — Discharge Instructions (Addendum)
You are in need of a higher level of care than we can provide. I recommend that you go to the hospital now for complete testing of your recurrent chest pain given your history of heart attacks, coronary vasospasms and consideration for testing/ruling out aortic dissection and pulmonary embolism. Please head to the hospital now.

## 2021-11-04 NOTE — ED Notes (Signed)
Patient states "yall can take me off the list im leaving."

## 2021-11-04 NOTE — ED Provider Triage Note (Signed)
Emergency Medicine Provider Triage Evaluation Note  Chad Craig , a 26 y.o. male  was evaluated in triage.  Pt complains of chest pain that started yesterday.  This pain has been constant.  It is in the center of his chest radiates to his back.  He says it feels like an elephant is sitting on his chest.  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  He was sent here by urgent care for further evaluation.  Patient has no neurological symptoms.   Review of Systems  Positive:  Negative:   Physical Exam  BP (!) 165/85 (BP Location: Left Arm)    Pulse 71    Temp 97.7 F (36.5 C) (Oral)    Resp 15    Ht 5\' 7"  (1.702 m)    Wt 59.9 kg    SpO2 98%    BMI 20.67 kg/m  Gen:   Awake, no distress   Resp:  Normal effort  MSK:   Moves extremities without difficulty  Other:  Pulses normal with no deficits.  Sensation grossly intact in upper and lower extremities.  Medical Decision Making  Medically screening exam initiated at 2:56 PM.  Appropriate orders placed.  Chad Craig was informed that the remainder of the evaluation will be completed by another provider, this initial triage assessment does not replace that evaluation, and the importance of remaining in the ED until their evaluation is complete.  Apparently urgent care wanted him to come here to get a CT according to the patient.  He wanted rule out dissection.  At this time, I do not think that patient needs to get this image.  Plan to reassess after initial labs are taken.   Adolphus Birchwood, [place] 11/04/21 1458

## 2021-11-04 NOTE — ED Triage Notes (Addendum)
Pt arrived POV from urgent care c/o CP that started yesterday. Pt states the CP is more on the right and radiates to his shoulder blades. Pt deneis any SHOB or N/V. Pt states it feels like someone is sitting on my chest. Pt has a hx of MI

## 2021-11-04 NOTE — ED Provider Notes (Signed)
[facility]   MRN: [id number] DOB: 1996/03/13  Subjective:   Chad Craig is a 26 y.o. male presenting for acute onset of moderate to severe right to left-sided chest pain that radiates into the back.  Symptoms have been fairly persistent, started yesterday around noon.  Denies shortness of breath, coughing, hemoptysis, nausea, vomiting, abdominal pain, confusion, headache, dizziness.  Patient has a history of an atrial arrhythmia and coronary vasospasm.  He did have heart cath that was done and was told he had 3 heart attacks but was not demonstrated to have coronary artery disease through the heart cath.  He did have an echocardiogram done around the same time and was normal.  He has had extensive cardiac work-up and the lab results that are available have been completely negative.  No history of pulmonary embolism.  Does not follow cardiologist regularly.  He does smoke marijuana multiple times daily, has more than 10-year history of this.  He is also a smoker, currently smokes 1/2 ppd but has previously smoked as much as 2 ppd.  Of note, patient is also undergoing a significant amount of stress as he is going through a divorce and is trying to pay for his home on his own now.  No current facility-administered medications for this encounter.  Current Outpatient Medications:    lidocaine (XYLOCAINE) 2 % solution, Use as directed 15 mLs in the mouth or throat every 4 (four) hours as needed for mouth pain., Disp: 100 mL, Rfl: 0   Allergies  Allergen Reactions   Penicillins Hives   Motrin [Ibuprofen] Rash    Past Medical History:  Diagnosis Date   Atrial arrhythmia    Coronary vasospasm    hx of MI at age 66 - no CAD at cath; ? myopericarditis vs spasm   Heart attack (HCC)    x3   Hx of cardiac cath    a. LHC (03/08/14):  normal cors; EF 55% with subtle ant HK   Hx of echocardiogram    a. 03/08/14: EF 50%, no WMA, mild MR, no effusion     Past Surgical History:   Procedure Laterality Date   LEFT HEART CATHETERIZATION WITH CORONARY ANGIOGRAM N/A 03/08/2014   Procedure: LEFT HEART CATHETERIZATION WITH CORONARY ANGIOGRAM;  Surgeon: Kathleene Hazel, MD;  Location: [facility] CATH LAB;  Service: Cardiovascular;  Laterality: N/A;   None     NOSE SURGERY      Family History  Problem Relation Age of Onset   Heart attack Maternal Grandfather     Social History   Tobacco Use   Smoking status: Every Day    Packs/day: 1.00    Years: 5.00    Pack years: 5.00    Types: Cigarettes   Smokeless tobacco: Never  Substance Use Topics   Alcohol use: Not Currently   Drug use: Not Currently    ROS   Objective:   Vitals: BP 121/80    Pulse 65    Temp 98 F (36.7 C)    Resp 18    SpO2 98%   Physical Exam Constitutional:      General: He is not in acute distress.    Appearance: Normal appearance. He is well-developed. He is not ill-appearing, toxic-appearing or diaphoretic.  HENT:     Head: Normocephalic and atraumatic.     Right Ear: External ear normal.     Left Ear: External ear normal.     Nose: Nose normal.     Mouth/Throat:  Mouth: Mucous membranes are moist.  Eyes:     General: No scleral icterus.       Right eye: No discharge.        Left eye: No discharge.     Extraocular Movements: Extraocular movements intact.  Cardiovascular:     Rate and Rhythm: Normal rate and regular rhythm.     Heart sounds: Normal heart sounds. No murmur heard.   No friction rub. No gallop.  Pulmonary:     Effort: Pulmonary effort is normal. No respiratory distress.     Breath sounds: Normal breath sounds. No stridor. No wheezing, rhonchi or rales.  Chest:     Chest wall: Tenderness present.    Neurological:     Mental Status: He is alert and oriented to person, place, and time.  Psychiatric:        Mood and Affect: Mood normal.        Behavior: Behavior normal.        Thought Content: Thought content normal.    DG Chest 2 View  Result Date:  11/04/2021 CLINICAL DATA:  Chest pain EXAM: CHEST - 2 VIEW COMPARISON:  August 13, 2017 FINDINGS: The heart size and mediastinal contours are within normal limits. No focal consolidation. No pleural effusion. No pneumothorax. The visualized skeletal structures are unremarkable. IMPRESSION: No active cardiopulmonary disease. Electronically Signed   By: Maudry Mayhew M.D.   On: 11/04/2021 10:46    ED ECG REPORT   Date: 11/04/2021  EKG Time: 10:44 AM  Rate: 60 bpm  Rhythm: normal sinus rhythm,  unchanged from previous tracings  Axis: Normal  Intervals: Short PR interval  ST&T Change: T wave flattening in lead I, T wave inversion in lead aVL  Narrative Interpretation: Sinus rhythm at 60 bpm with a short PR interval and nonspecific T wave changes.  Very comparable to previous EKG.  Assessment and Plan :   PDMP not reviewed this encounter.  1. Atypical chest pain   2. Marijuana abuse   3. Smoker   4. History of coronary vasospasm   5. Stress at home    No acute cardiopulmonary process found on chest x-ray or his EKG.  Discussed with patient differential which includes a recurrent coronary vasospasm, atypical ACS, aortic dissection and pulmonary embolism.  I did encourage him to work at smoking cessation, cutting back on his marijuana abuse as I suspect this is also playing a role.  However given his history, the differential and his ongoing chest pain recommended an ER visit.  Patient is hemodynamically stable, therefore does not require transport to the hospital by EMS.  He is in agreement, will go there by personal vehicle now.   Wallis Bamberg, PA-C 11/04/21 1119

## 2021-11-04 NOTE — ED Triage Notes (Signed)
Pt presents with c/o chest pain that began around 12 pm yesterday. Pt states right chest and left chest is area involved also reports pain between shoulder blades  , has taken 1 baby ASA this am at 6:00

## 2021-11-23 ENCOUNTER — Telehealth: Payer: Self-pay

## 2021-11-23 NOTE — Telephone Encounter (Signed)
NOTES SCANNED TO REFERREAL

## 2021-12-06 ENCOUNTER — Ambulatory Visit: Payer: 59 | Admitting: Internal Medicine

## 2022-01-17 ENCOUNTER — Ambulatory Visit
Admission: EM | Admit: 2022-01-17 | Discharge: 2022-01-17 | Disposition: A | Discharge status: Home / Self Care | Payer: 59 | Attending: Urgent Care | Admitting: Urgent Care

## 2022-01-17 ENCOUNTER — Other Ambulatory Visit: Payer: Self-pay

## 2022-01-17 ENCOUNTER — Encounter: Payer: Self-pay | Admitting: Urgent Care

## 2022-01-17 DIAGNOSIS — R112 Nausea with vomiting, unspecified: Secondary | ICD-10-CM | POA: Diagnosis not present

## 2022-01-17 DIAGNOSIS — R197 Diarrhea, unspecified: Secondary | ICD-10-CM | POA: Diagnosis not present

## 2022-01-17 DIAGNOSIS — A084 Viral intestinal infection, unspecified: Secondary | ICD-10-CM | POA: Diagnosis not present

## 2022-01-17 MED ORDER — LOPERAMIDE HCL 2 MG PO CAPS: 2.0000 mg | ORAL_CAPSULE | Freq: Two times a day (BID) | ORAL | 0 refills | Status: AC | PRN

## 2022-01-17 MED ORDER — ONDANSETRON 8 MG PO TBDP: 8.0000 mg | ORAL_TABLET | Freq: Three times a day (TID) | ORAL | 0 refills | Status: AC | PRN

## 2022-01-17 NOTE — ED Provider Notes (Signed)
?[facility] ? ? ?MRN: [id number] DOB: [birth year] ? ?Subjective:  ? ?Chad Craig is a 26 y.o. male presenting for acute onset since this morning at 3 AM with nausea, vomiting, diarrhea and upset stomach.  Has had belly pain over the stomach area this morning.  Could not go to work.  No bloody stools, hematemesis.  No recent antibiotic use.  No hospitalizations.  No history of gastrointestinal disorders.  Patient did eat at a cookout yesterday.  No alcohol use. ? ?No current facility-administered medications for this encounter. ? ?Current Outpatient Medications:  ?  lidocaine (XYLOCAINE) 2 % solution, Use as directed 15 mLs in the mouth or throat every 4 (four) hours as needed for mouth pain., Disp: 100 mL, Rfl: 0  ? ?Allergies  ?Allergen Reactions  ? Penicillins Hives  ? Motrin [Ibuprofen] Rash  ? ? ?Past Medical History:  ?Diagnosis Date  ? Atrial arrhythmia   ? Coronary vasospasm   ? hx of MI at age 73 - no CAD at cath; ? myopericarditis vs spasm  ? Heart attack (HCC)   ? x3  ? Hx of cardiac cath   ? a. LHC (03/08/14):  normal cors; EF 55% with subtle ant HK  ? Hx of echocardiogram   ? a. 03/08/14: EF 50%, no WMA, mild MR, no effusion  ?  ? ?Past Surgical History:  ?Procedure Laterality Date  ? LEFT HEART CATHETERIZATION WITH CORONARY ANGIOGRAM N/A 03/08/2014  ? Procedure: LEFT HEART CATHETERIZATION WITH CORONARY ANGIOGRAM;  Surgeon: Kathleene Hazel, MD;  Location: [facility] CATH LAB;  Service: Cardiovascular;  Laterality: N/A;  ? None    ? NOSE SURGERY    ? ? ?Family History  ?Problem Relation Age of Onset  ? Heart attack Maternal Grandfather   ? ? ?Social History  ? ?Tobacco Use  ? Smoking status: Every Day  ?  Packs/day: 1.00  ?  Years: 5.00  ?  Pack years: 5.00  ?  Types: Cigarettes  ? Smokeless tobacco: Never  ?Substance Use Topics  ? Alcohol use: Not Currently  ? Drug use: Not Currently  ? ? ?ROS ? ? ?Objective:  ? ?Vitals: ?BP 109/69   Pulse (!) 53 Comment: reports having bradycardia  Temp  98.6 ?F (37 ?C)   Resp 18   SpO2 98%  ? ?Physical Exam ?Constitutional:   ?   General: He is not in acute distress. ?   Appearance: Normal appearance. He is well-developed and normal weight. He is not ill-appearing, toxic-appearing or diaphoretic.  ?HENT:  ?   Head: Normocephalic and atraumatic.  ?   Right Ear: External ear normal.  ?   Left Ear: External ear normal.  ?   Nose: Nose normal.  ?   Mouth/Throat:  ?   Pharynx: Oropharynx is clear.  ?Eyes:  ?   General: No scleral icterus.    ?   Right eye: No discharge.     ?   Left eye: No discharge.  ?   Extraocular Movements: Extraocular movements intact.  ?Cardiovascular:  ?   Rate and Rhythm: Normal rate.  ?Pulmonary:  ?   Effort: Pulmonary effort is normal.  ?Abdominal:  ?   General: Bowel sounds are increased. There is no distension.  ?   Palpations: Abdomen is soft. There is no mass.  ?   Tenderness: There is generalized abdominal tenderness and tenderness in the epigastric area. There is no right CVA tenderness, left CVA tenderness, guarding or rebound.  ?  Musculoskeletal:  ?   Cervical back: Normal range of motion.  ?Neurological:  ?   Mental Status: He is alert and oriented to person, place, and time.  ?Psychiatric:     ?   Mood and Affect: Mood normal.     ?   Behavior: Behavior normal.     ?   Thought Content: Thought content normal.     ?   Judgment: Judgment normal.  ? ? ?Assessment and Plan :  ? ?PDMP not reviewed this encounter. ? ?1. Viral gastroenteritis   ?2. Nausea vomiting and diarrhea   ? ?Will manage for suspected viral gastroenteritis with supportive care.  Recommended patient hydrate well, eat light meals and maintain electrolytes.  Will use Zofran and Imodium for nausea, vomiting and diarrhea. Counseled patient on potential for adverse effects with medications prescribed/recommended today, ER and return-to-clinic precautions discussed, patient verbalized understanding.  ?  ?Wallis Bamberg, PA-C ?01/17/22 1026 ? ?

## 2022-01-17 NOTE — ED Triage Notes (Signed)
Pt presents with c/o vomiting and diarrhea that began at 3 am this morning ?

## 2022-01-17 NOTE — Discharge Instructions (Addendum)

## 2022-01-19 ENCOUNTER — Other Ambulatory Visit: Payer: Self-pay

## 2022-01-19 ENCOUNTER — Ambulatory Visit
Admission: EM | Admit: 2022-01-19 | Discharge: 2022-01-19 | Disposition: A | Discharge status: Home / Self Care | Payer: 59 | Attending: Family Medicine | Admitting: Family Medicine

## 2022-01-19 DIAGNOSIS — R112 Nausea with vomiting, unspecified: Secondary | ICD-10-CM

## 2022-01-19 MED ORDER — PROMETHAZINE HCL 25 MG PO TABS: 25.0000 mg | ORAL_TABLET | Freq: Four times a day (QID) | ORAL | 0 refills | Status: AC | PRN

## 2022-01-19 NOTE — ED Provider Notes (Signed)
?[facility] ? ? ? ?CSN: [id number] ?Arrival date & time: 01/19/22  4098 ? ? ?  ? ?History   ?Chief Complaint ?Chief Complaint  ?Patient presents with  ? Nausea  ? ? ?HPI ?Chad Craig is a 26 y.o. male.  ? ?Presenting today following up on visit from 2 days ago for nausea, vomiting, diarrhea diagnosed as viral gastroenteritis.  Has been taking Imodium and Zofran as needed, eating bland foods and states he was feeling better until this morning when he started vomiting again at work despite taking Zofran this morning.  States he was sent home from work and told to come back here for a recheck.  He denies fever, chills, abdominal pain, dizziness, headache, intolerance to p.o. ? ? ?Past Medical History:  ?Diagnosis Date  ? Atrial arrhythmia   ? Coronary vasospasm   ? hx of MI at age 57 - no CAD at cath; ? myopericarditis vs spasm  ? Heart attack (HCC)   ? x3  ? Hx of cardiac cath   ? a. LHC (03/08/14):  normal cors; EF 55% with subtle ant HK  ? Hx of echocardiogram   ? a. 03/08/14: EF 50%, no WMA, mild MR, no effusion  ? ? ?Patient Active Problem List  ? Diagnosis Date Noted  ? Pain in left ankle and joints of left foot 08/11/2021  ? Chest pain 02/23/2014  ? ? ?Past Surgical History:  ?Procedure Laterality Date  ? LEFT HEART CATHETERIZATION WITH CORONARY ANGIOGRAM N/A 03/08/2014  ? Procedure: LEFT HEART CATHETERIZATION WITH CORONARY ANGIOGRAM;  Surgeon: Kathleene Hazel, MD;  Location: [facility] CATH LAB;  Service: Cardiovascular;  Laterality: N/A;  ? None    ? NOSE SURGERY    ? ? ? ? ? ?Home Medications   ? ?Prior to Admission medications   ?Medication Sig Start Date End Date Taking? Authorizing Provider  ?promethazine (PHENERGAN) 25 MG tablet Take 1 tablet (25 mg total) by mouth every 6 (six) hours as needed for nausea or vomiting. May cause drowsiness, no driving with this medication 01/19/22  Yes Particia Nearing, PA-C  ?lidocaine (XYLOCAINE) 2 % solution Use as directed 15 mLs in the mouth or throat  every 4 (four) hours as needed for mouth pain. 10/12/21   Rhys Martini, PA-C  ?loperamide (IMODIUM) 2 MG capsule Take 1 capsule (2 mg total) by mouth 2 (two) times daily as needed for diarrhea or loose stools. 01/17/22   Wallis Bamberg, PA-C  ?ondansetron (ZOFRAN-ODT) 8 MG disintegrating tablet Take 1 tablet (8 mg total) by mouth every 8 (eight) hours as needed for nausea or vomiting. 01/17/22   Wallis Bamberg, PA-C  ? ? ?Family History ?Family History  ?Problem Relation Age of Onset  ? Heart attack Maternal Grandfather   ? ? ?Social History ?Social History  ? ?Tobacco Use  ? Smoking status: Former  ?  Packs/day: 1.00  ?  Years: 5.00  ?  Pack years: 5.00  ?  Types: Cigarettes  ?  Passive exposure: Never  ? Smokeless tobacco: Never  ?Vaping Use  ? Vaping Use: Former  ?Substance Use Topics  ? Alcohol use: Not Currently  ? Drug use: Not Currently  ? ? ? ?Allergies   ?Penicillins and Motrin [ibuprofen] ? ? ?Review of Systems ?Review of Systems ?Per HPI ? ?Physical Exam ?Triage Vital Signs ?ED Triage Vitals  ?Enc Vitals Group  ?   BP 01/19/22 1013 113/64  ?   Pulse Rate 01/19/22 1013 (!)  54  ?   Resp 01/19/22 1013 18  ?   Temp 01/19/22 1013 97.9 ?F (36.6 ?C)  ?   Temp Source 01/19/22 1013 Oral  ?   SpO2 01/19/22 1013 98 %  ?   Weight --   ?   Height --   ?   Head Circumference --   ?   Peak Flow --   ?   Pain Score 01/19/22 1011 4  ?   Pain Loc --   ?   Pain Edu? --   ?   Excl. in GC? --   ? ?No data found. ? ?Updated Vital Signs ?BP 113/64 (BP Location: Right Arm)   Pulse (!) 54   Temp 97.9 ?F (36.6 ?C) (Oral)   Resp 18   SpO2 98%  ? ?Visual Acuity ?Right Eye Distance:   ?Left Eye Distance:   ?Bilateral Distance:   ? ?Right Eye Near:   ?Left Eye Near:    ?Bilateral Near:    ? ?Physical Exam ?Vitals and nursing note reviewed.  ?Constitutional:   ?   Appearance: Normal appearance.  ?HENT:  ?   Head: Atraumatic.  ?Eyes:  ?   Extraocular Movements: Extraocular movements intact.  ?   Conjunctiva/sclera: Conjunctivae normal.   ?Cardiovascular:  ?   Rate and Rhythm: Normal rate and regular rhythm.  ?Pulmonary:  ?   Effort: Pulmonary effort is normal.  ?   Breath sounds: Normal breath sounds.  ?Abdominal:  ?   General: Bowel sounds are normal. There is no distension.  ?   Palpations: Abdomen is soft.  ?   Tenderness: There is no abdominal tenderness. There is no right CVA tenderness, left CVA tenderness or guarding.  ?Musculoskeletal:     ?   General: Normal range of motion.  ?   Cervical back: Normal range of motion and neck supple.  ?Skin: ?   General: Skin is warm and dry.  ?Neurological:  ?   General: No focal deficit present.  ?   Mental Status: He is oriented to person, place, and time.  ?Psychiatric:     ?   Mood and Affect: Mood normal.     ?   Thought Content: Thought content normal.     ?   Judgment: Judgment normal.  ? ? ? ?UC Treatments / Results  ?Labs ?(all labs ordered are listed, but only abnormal results are displayed) ?Labs Reviewed - No data to display ? ?EKG ? ? ?Radiology ?No results found. ? ?Procedures ?Procedures (including critical care time) ? ?Medications Ordered in UC ?Medications - No data to display ? ?Initial Impression / Assessment and Plan / UC Course  ?I have reviewed the triage vital signs and the nursing notes. ? ?Pertinent labs & imaging results that were available during my care of the patient were reviewed by me and considered in my medical decision making (see chart for details). ? ?  ? ?Vitals and exam overall reassuring today, suspect lingering symptoms from viral GI illness.  Will add Phenergan for breakthrough nausea and vomiting despite Zofran, continue these medications for her.  Symptomatic relief and brat diet, fluids.  Work note given.  Return for worsening symptoms. ? ?Final Clinical Impressions(s) / UC Diagnoses  ? ?Final diagnoses:  ?Nausea and vomiting, unspecified vomiting type  ? ?Discharge Instructions   ?None ?  ? ?ED Prescriptions   ? ? Medication Sig Dispense Auth. Provider  ?  promethazine (PHENERGAN) 25 MG tablet Take 1 tablet (  25 mg total) by mouth every 6 (six) hours as needed for nausea or vomiting. May cause drowsiness, no driving with this medication 10 tablet Particia Nearing, [place]  ? ?  ? ?PDMP not reviewed this encounter. ?  ?Particia Nearing, PA-C ?01/19/22 1037 ? ?

## 2022-01-19 NOTE — ED Triage Notes (Signed)
Pt states he was here Monday for nausea and he went to work this morning and was sick on the stomach and vomited  ? ?Pt states the medicine he was given on Monday stopped the diarrhea but not the vomiting ?

## 2022-07-14 ENCOUNTER — Ambulatory Visit
Admission: EM | Admit: 2022-07-14 | Discharge: 2022-07-14 | Disposition: A | Discharge status: Home / Self Care | Payer: 59 | Attending: Nurse Practitioner | Admitting: Nurse Practitioner

## 2022-07-14 DIAGNOSIS — J069 Acute upper respiratory infection, unspecified: Secondary | ICD-10-CM | POA: Insufficient documentation

## 2022-07-14 DIAGNOSIS — J029 Acute pharyngitis, unspecified: Secondary | ICD-10-CM | POA: Insufficient documentation

## 2022-07-14 DIAGNOSIS — Z1152 Encounter for screening for COVID-19: Secondary | ICD-10-CM | POA: Insufficient documentation

## 2022-07-14 LAB — POCT RAPID STREP A (OFFICE): Rapid Strep A Screen: NEGATIVE

## 2022-07-14 MED ORDER — BENZONATATE 100 MG PO CAPS: 100.0000 mg | ORAL_CAPSULE | Freq: Three times a day (TID) | ORAL | 0 refills | Status: DC | PRN

## 2022-07-14 NOTE — ED Triage Notes (Signed)
Pt reports sore throat x 1 day; fever, headcahe since this morning,

## 2022-07-14 NOTE — Discharge Instructions (Addendum)
You have a viral upper respiratory infection.  We have tested you for COVID-19 and will notify you with positive results.  Please stay home and isolate until you are aware of results.  Some things that can make you feel better are: - Increased rest - Increasing fluid with water/sugar free electrolytes - Acetaminophen and ibuprofen as needed for fever/pain.  - Salt water gargling, chloraseptic spray and throat lozenges - OTC guaifenesin (Mucinex) 600 mg twice daily.  - Saline sinus flushes or a neti pot.  - Humidifying the air. - Cough perles as needed for dry cough if this develops.

## 2022-07-14 NOTE — ED Provider Notes (Signed)
[facility]    CSN: [id number] Arrival date & time: 07/14/22  1231      History   Chief Complaint Chief Complaint  Patient presents with   Sore Throat   Fever    HPI Chad Craig is a 26 y.o. male.   Patient presents with 1 day of fever, chest and nasal congestion, post nasal drainage, sneezing, sore throat, headache, decreased appetite, and fatigue.  He denies cough, shortness of breath, wheezing, chest pain or tightness, runny nose, sinus pressure, ear pain or pressure, abdominal pain, nausea/vomiting, and diarrhea.  No new rash.  No known sick contacts.  Took Benadryl earlier today which he thinks did help with his symptoms "some."     Past Medical History:  Diagnosis Date   Atrial arrhythmia    Coronary vasospasm    hx of MI at age 17 - no CAD at cath; ? myopericarditis vs spasm   Heart attack (HCC)    x3   Hx of cardiac cath    a. LHC (03/08/14):  normal cors; EF 55% with subtle ant HK   Hx of echocardiogram    a. 03/08/14: EF 50%, no WMA, mild MR, no effusion    Patient Active Problem List   Diagnosis Date Noted   Pain in left ankle and joints of left foot 08/11/2021   Chest pain 02/23/2014    Past Surgical History:  Procedure Laterality Date   LEFT HEART CATHETERIZATION WITH CORONARY ANGIOGRAM N/A 03/08/2014   Procedure: LEFT HEART CATHETERIZATION WITH CORONARY ANGIOGRAM;  Surgeon: Kathleene Hazel, MD;  Location: [facility] CATH LAB;  Service: Cardiovascular;  Laterality: N/A;   None     NOSE SURGERY         Home Medications    Prior to Admission medications   Medication Sig Start Date End Date Taking? Authorizing Provider  benzonatate (TESSALON) 100 MG capsule Take 1 capsule (100 mg total) by mouth 3 (three) times daily as needed for cough. Do not take with alcohol or while driving or operating heavy machinery 07/14/22  Yes Cathlean Marseilles A, NP  lidocaine (XYLOCAINE) 2 % solution Use as directed 15 mLs in the mouth or throat every 4 (four)  hours as needed for mouth pain. 10/12/21   Rhys Martini, PA-C  loperamide (IMODIUM) 2 MG capsule Take 1 capsule (2 mg total) by mouth 2 (two) times daily as needed for diarrhea or loose stools. 01/17/22   Wallis Bamberg, PA-C  ondansetron (ZOFRAN-ODT) 8 MG disintegrating tablet Take 1 tablet (8 mg total) by mouth every 8 (eight) hours as needed for nausea or vomiting. 01/17/22   Wallis Bamberg, PA-C  promethazine (PHENERGAN) 25 MG tablet Take 1 tablet (25 mg total) by mouth every 6 (six) hours as needed for nausea or vomiting. May cause drowsiness, no driving with this medication 01/19/22   Particia Nearing, PA-C    Family History Family History  Problem Relation Age of Onset   Heart attack Maternal Grandfather     Social History Social History   Tobacco Use   Smoking status: Former    Packs/day: 1.00    Years: 5.00    Total pack years: 5.00    Types: Cigarettes    Passive exposure: Never   Smokeless tobacco: Never  Vaping Use   Vaping Use: Former  Substance Use Topics   Alcohol use: Not Currently   Drug use: Not Currently     Allergies   Penicillins and Motrin [ibuprofen]  Review of Systems Review of Systems Per HPI  Physical Exam Triage Vital Signs ED Triage Vitals  Enc Vitals Group     BP 07/14/22 1328 115/76     Pulse Rate 07/14/22 1328 (!) 59     Resp 07/14/22 1328 16     Temp 07/14/22 1328 98.4 F (36.9 C)     Temp Source 07/14/22 1328 Oral     SpO2 07/14/22 1328 98 %     Weight --      Height --      Head Circumference --      Peak Flow --      Pain Score 07/14/22 1326 7     Pain Loc --      Pain Edu? --      Excl. in [place]? --    No data found.  Updated Vital Signs BP 115/76 (BP Location: Right Arm)   Pulse (!) 59   Temp 98.4 F (36.9 C) (Oral)   Resp 16   SpO2 98%   Visual Acuity Right Eye Distance:   Left Eye Distance:   Bilateral Distance:    Right Eye Near:   Left Eye Near:    Bilateral Near:     Physical Exam Vitals and nursing  note reviewed.  Constitutional:      General: He is not in acute distress.    Appearance: Normal appearance. He is not ill-appearing or toxic-appearing.  HENT:     Head: Normocephalic and atraumatic.     Right Ear: Tympanic membrane, ear canal and external ear normal.     Left Ear: Tympanic membrane, ear canal and external ear normal.     Nose: Congestion and rhinorrhea present.     Mouth/Throat:     Mouth: Mucous membranes are moist.     Pharynx: Oropharynx is clear. Posterior oropharyngeal erythema present. No oropharyngeal exudate.  Eyes:     General: No scleral icterus.    Extraocular Movements: Extraocular movements intact.  Cardiovascular:     Rate and Rhythm: Normal rate and regular rhythm.  Pulmonary:     Effort: Pulmonary effort is normal. No respiratory distress.     Breath sounds: Normal breath sounds. No wheezing, rhonchi or rales.  Abdominal:     General: Abdomen is flat. Bowel sounds are normal. There is no distension.     Palpations: Abdomen is soft.     Tenderness: There is no abdominal tenderness. There is no guarding.  Musculoskeletal:     Cervical back: Normal range of motion and neck supple.  Lymphadenopathy:     Cervical: Cervical adenopathy present.  Skin:    General: Skin is warm and dry.     Coloration: Skin is not jaundiced or pale.     Findings: No erythema or rash.  Neurological:     Mental Status: He is alert and oriented to person, place, and time.  Psychiatric:        Behavior: Behavior is cooperative.      UC Treatments / Results  Labs (all labs ordered are listed, but only abnormal results are displayed) Labs Reviewed  SARS CORONAVIRUS 2 (TAT 6-24 HRS)  CULTURE, GROUP A STREP [facility])  POCT RAPID STREP A (OFFICE)    EKG   Radiology No results found.  Procedures Procedures (including critical care time)  Medications Ordered in UC Medications - No data to display  Initial Impression / Assessment and Plan / UC Course  I have  reviewed the triage vital signs and  the nursing notes.  Pertinent labs & imaging results that were available during my care of the patient were reviewed by me and considered in my medical decision making (see chart for details).    Patient is well-appearing, normotensive, afebrile, not tachycardic, not tachypneic, oxygenating well on room air.  Symptoms and examination consistent with viral upper respiratory infection.  Rapid strep throat test negative, throat culture pending.  COVID-19 testing obtained.  Supportive care discussed.  Start cough suppressant as needed for dry cough.  Return and ER precautions discussed.  Note given for work.  The patient was given the opportunity to ask questions.  All questions answered to their satisfaction.  The patient is in agreement to this plan.    Final Clinical Impressions(s) / UC Diagnoses   Final diagnoses:  Encounter for screening for COVID-19  Viral URI with cough  Streptococcal sore throat     Discharge Instructions      You have a viral upper respiratory infection.  We have tested you for COVID-19 and will notify you with positive results.  Please stay home and isolate until you are aware of results.  Some things that can make you feel better are: - Increased rest - Increasing fluid with water/sugar free electrolytes - Acetaminophen and ibuprofen as needed for fever/pain.  - Salt water gargling, chloraseptic spray and throat lozenges - OTC guaifenesin (Mucinex) 600 mg twice daily.  - Saline sinus flushes or a neti pot.  - Humidifying the air. - Cough perles as needed for dry cough if this develops.     ED Prescriptions     Medication Sig Dispense Auth. Provider   benzonatate (TESSALON) 100 MG capsule Take 1 capsule (100 mg total) by mouth 3 (three) times daily as needed for cough. Do not take with alcohol or while driving or operating heavy machinery 21 capsule Valentino Nose, NP      PDMP not reviewed this encounter.    Valentino Nose, NP 07/14/22 [phone]

## 2022-07-15 LAB — SARS CORONAVIRUS 2 (TAT 6-24 HRS): SARS Coronavirus 2: NEGATIVE

## 2022-07-16 LAB — CULTURE, GROUP A STREP (THRC)

## 2022-07-19 ENCOUNTER — Telehealth: Payer: Self-pay | Admitting: Emergency Medicine

## 2022-07-19 NOTE — Telephone Encounter (Signed)
Spoke with patient and states he is feeling better and throat is no longer sore.  Instructed patient to call back if symptoms change.  Informed of throat culture results.

## 2022-08-16 ENCOUNTER — Ambulatory Visit
Admission: EM | Admit: 2022-08-16 | Discharge: 2022-08-16 | Disposition: A | Discharge status: Home / Self Care | Payer: Medicaid Other

## 2022-08-16 DIAGNOSIS — M546 Pain in thoracic spine: Secondary | ICD-10-CM

## 2022-08-16 DIAGNOSIS — M545 Low back pain, unspecified: Secondary | ICD-10-CM

## 2022-08-16 MED ORDER — TIZANIDINE HCL 4 MG PO TABS
4.0000 mg | ORAL_TABLET | Freq: Four times a day (QID) | ORAL | 0 refills | Status: AC | PRN
Start: 2022-08-16 — End: ?
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
## 2022-09-01 ENCOUNTER — Encounter: Payer: Self-pay | Admitting: Emergency Medicine

## 2022-09-01 ENCOUNTER — Other Ambulatory Visit: Payer: Self-pay

## 2022-09-01 ENCOUNTER — Ambulatory Visit
Admission: EM | Admit: 2022-09-01 | Discharge: 2022-09-01 | Disposition: A | Discharge status: Home / Self Care | Payer: Medicaid Other | Attending: Family Medicine | Admitting: Family Medicine

## 2022-09-01 DIAGNOSIS — R079 Chest pain, unspecified: Secondary | ICD-10-CM

## 2022-09-01 NOTE — ED Notes (Signed)
Per PA, EKG and Vitals are stable and pt can return to waiting room. Discussed treatment options and care plan at [facility] and pt elected to go to ED for further evaluation.

## 2022-09-01 NOTE — ED Triage Notes (Signed)
Pt reports chest pain onset this am at 730 am. Pt reports pain had increased progressively throughout the day.

## 2023-03-29 IMAGING — DX DG CHEST 2V
2 series · 2 of 2 positions shown · non-contrast
Comparison: August 13, 2017

CLINICAL DATA: Chest pain

EXAM:
CHEST - 2 VIEW

[chest pa]
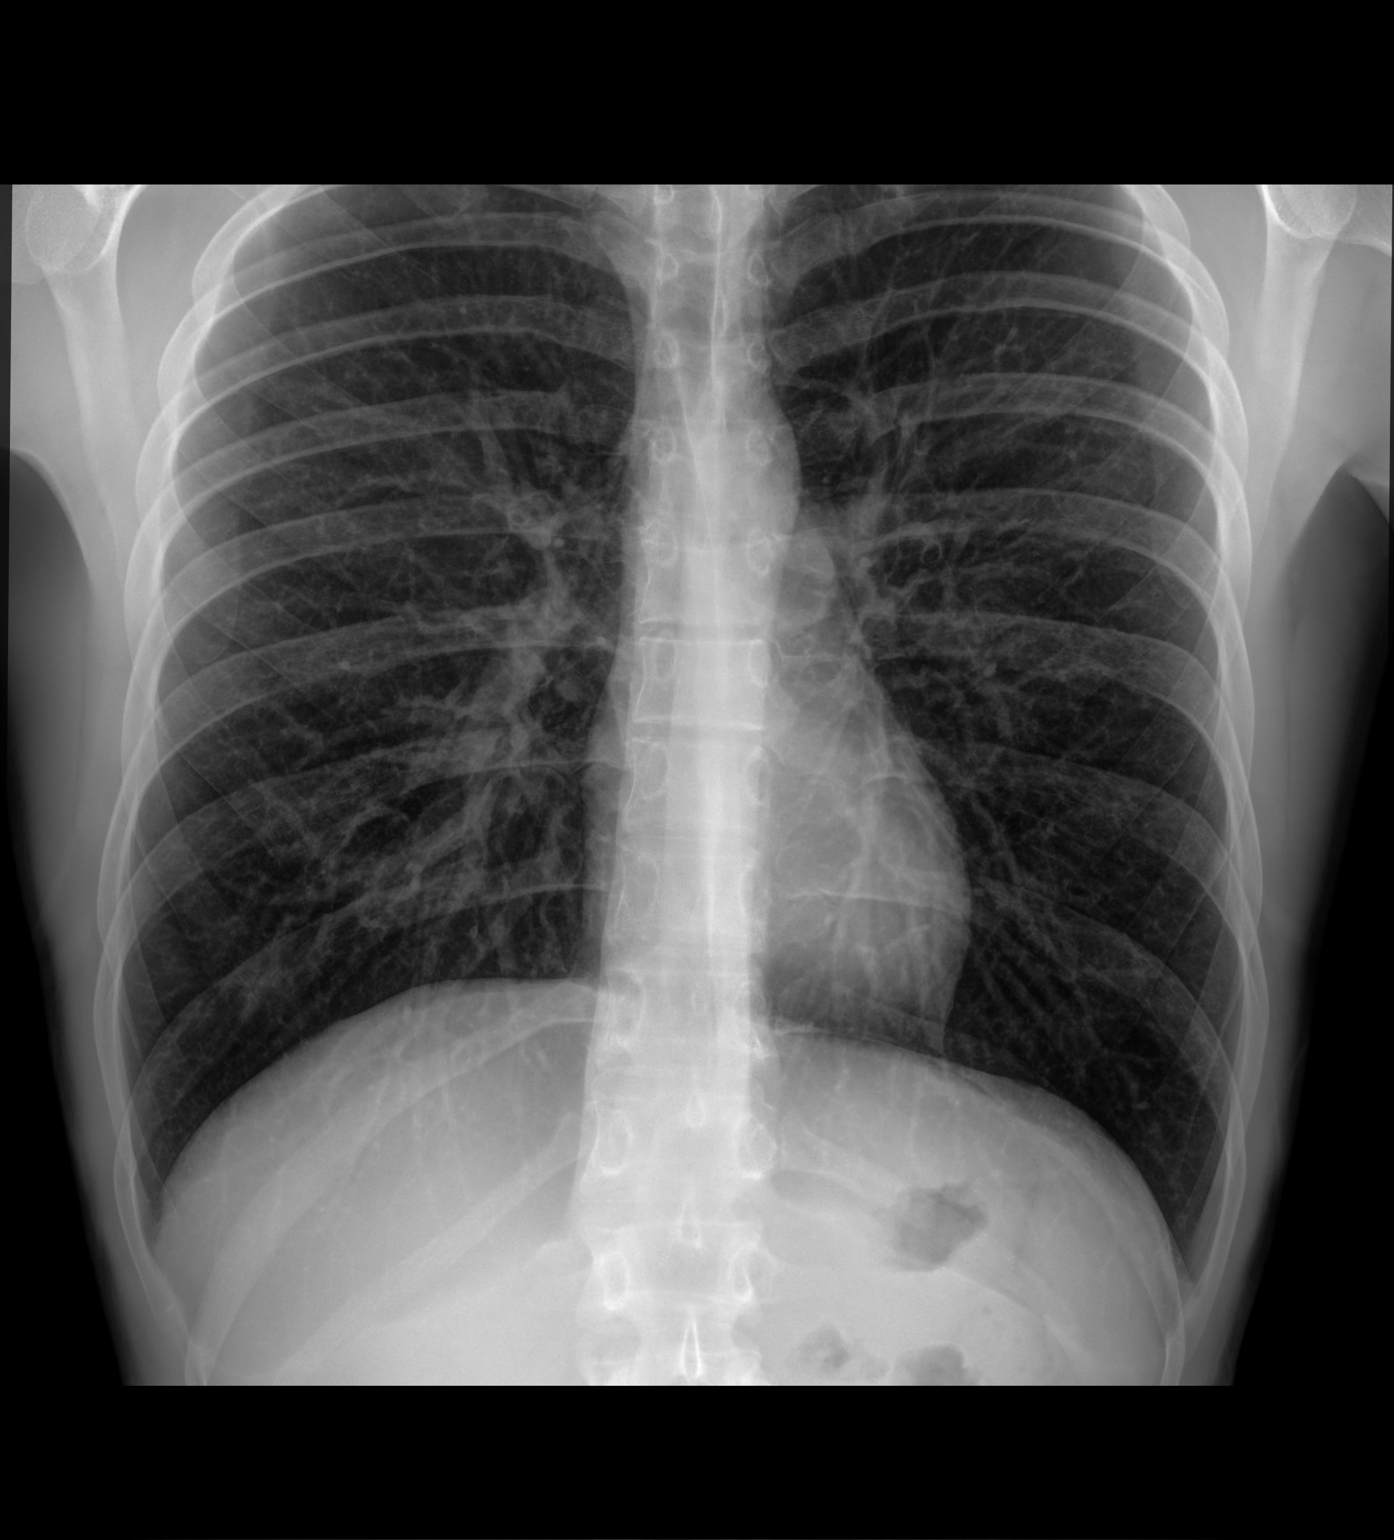

[chest lat]
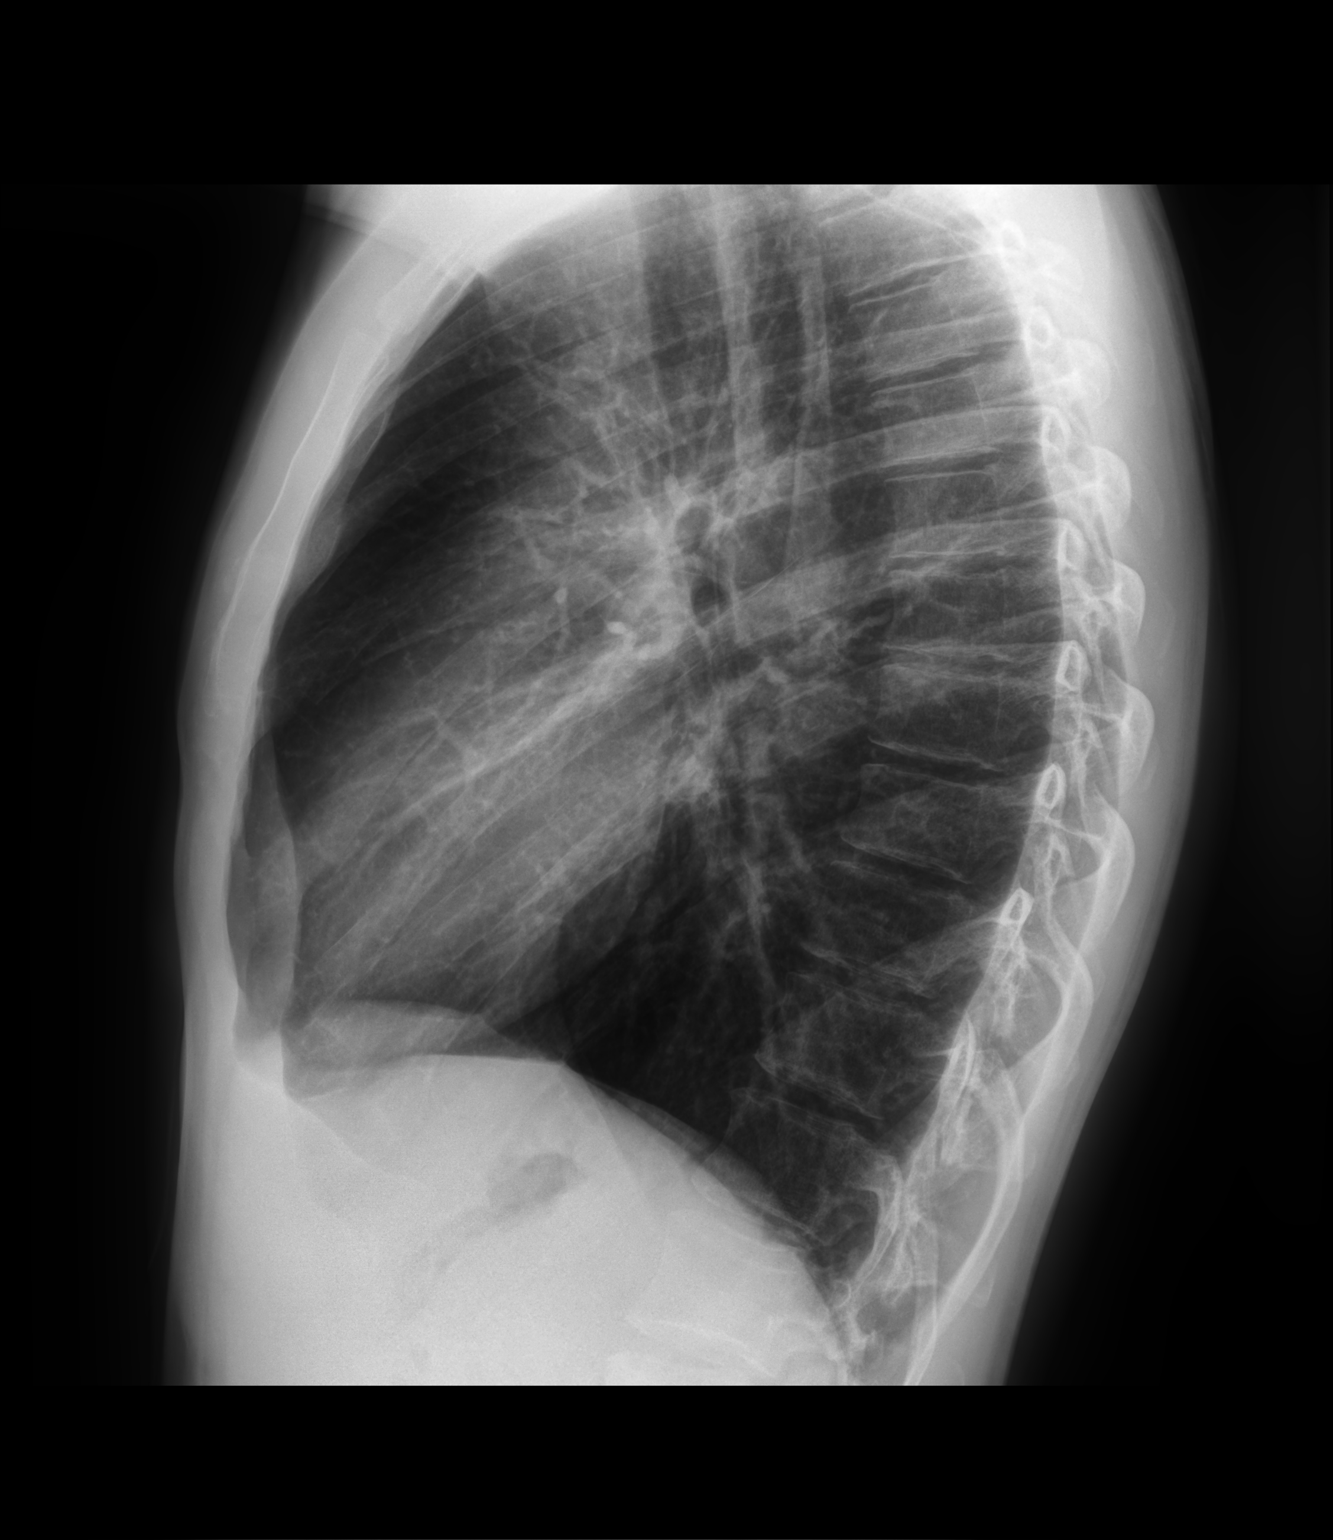

[2 of 2 positions shown; findings below may reference images not displayed]

FINDINGS: The heart size and mediastinal contours are within normal limits. No
focal consolidation. No pleural effusion. No pneumothorax. The
visualized skeletal structures are unremarkable.
IMPRESSION: No active cardiopulmonary disease.

## 2023-03-29 IMAGING — DX DG CHEST 2V
2 series · 2 of 2 positions shown · non-contrast
Comparison: Chest radiograph performed earlier on the same date.

CLINICAL DATA: Chest pain

EXAM:
CHEST - 2 VIEW

[w chest pa]
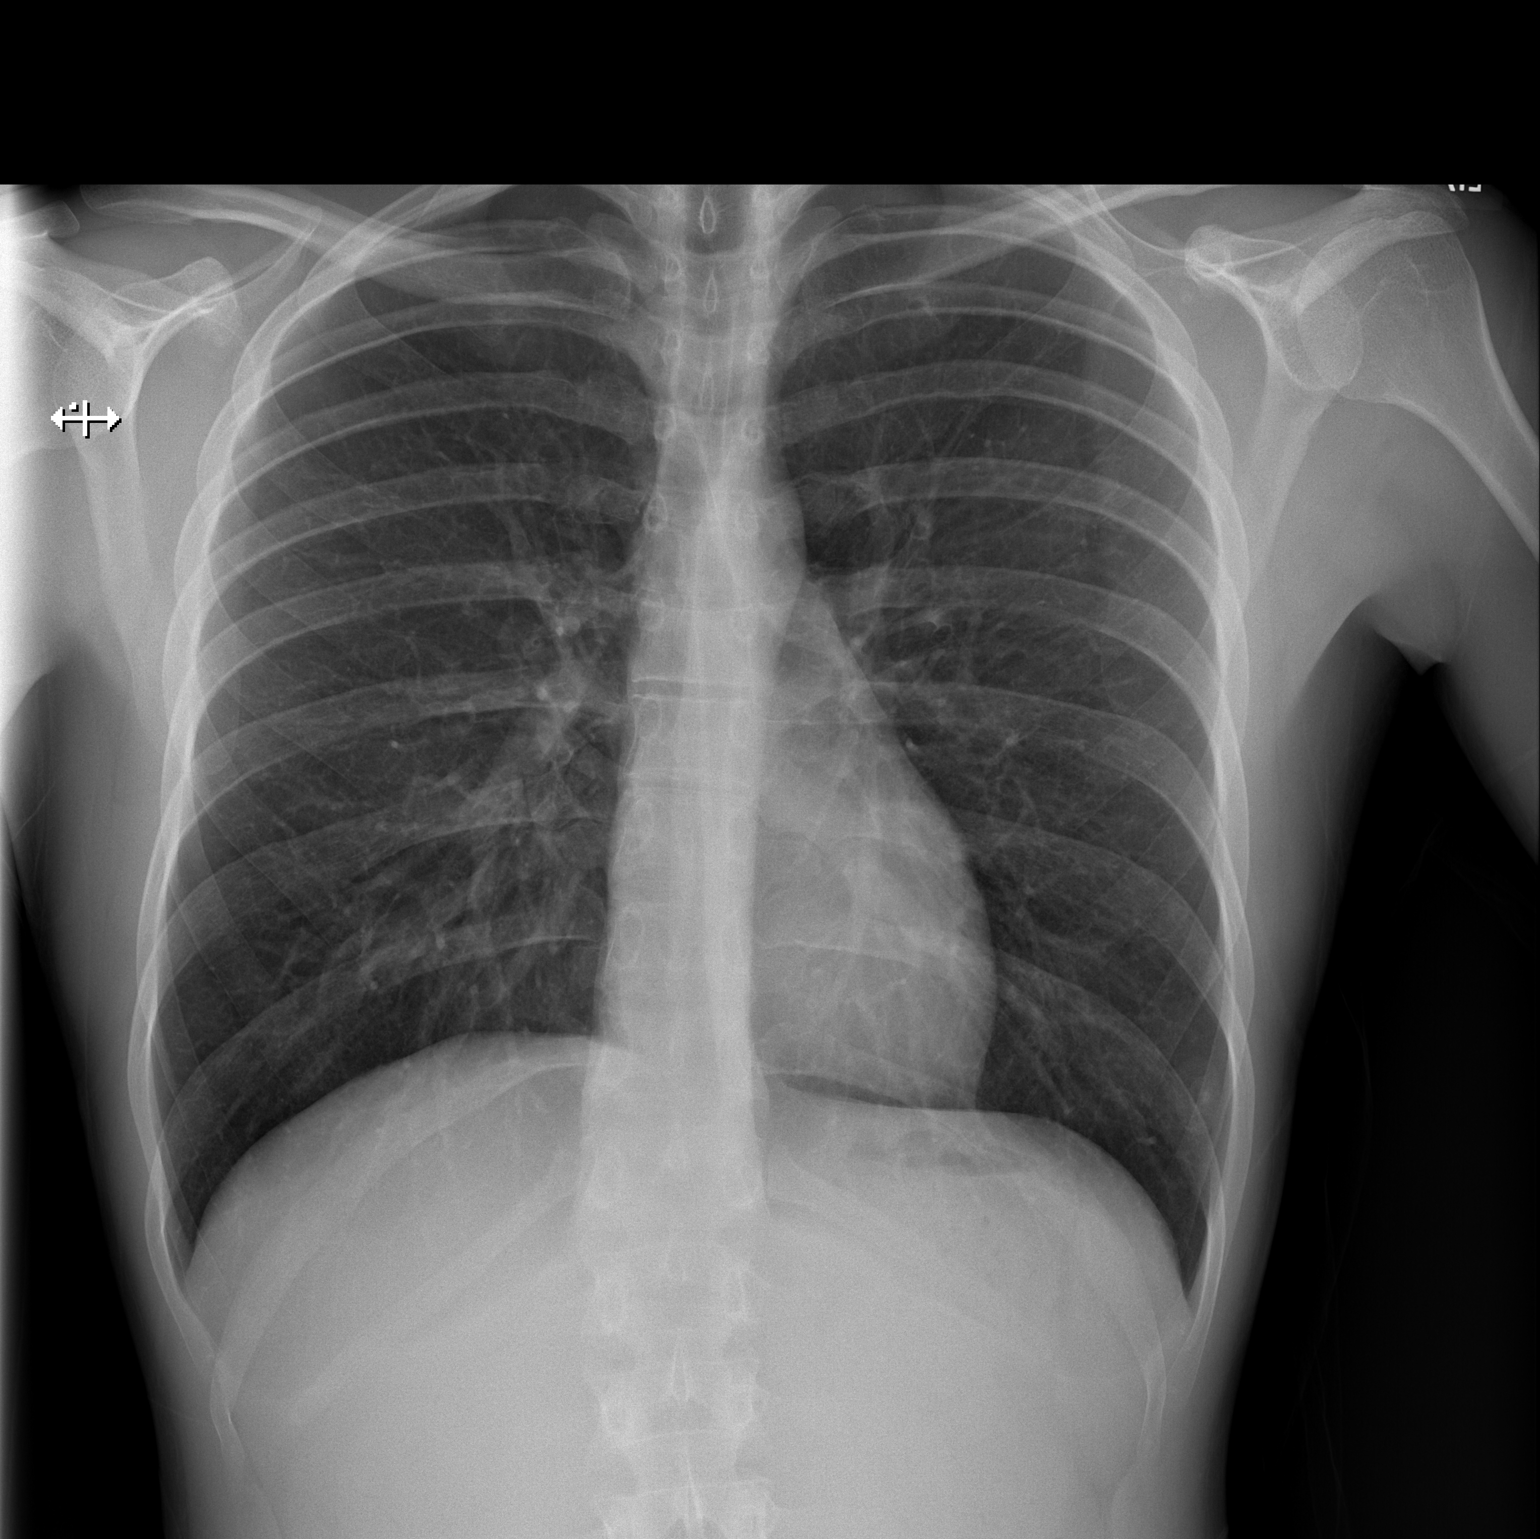

[w chest lat]
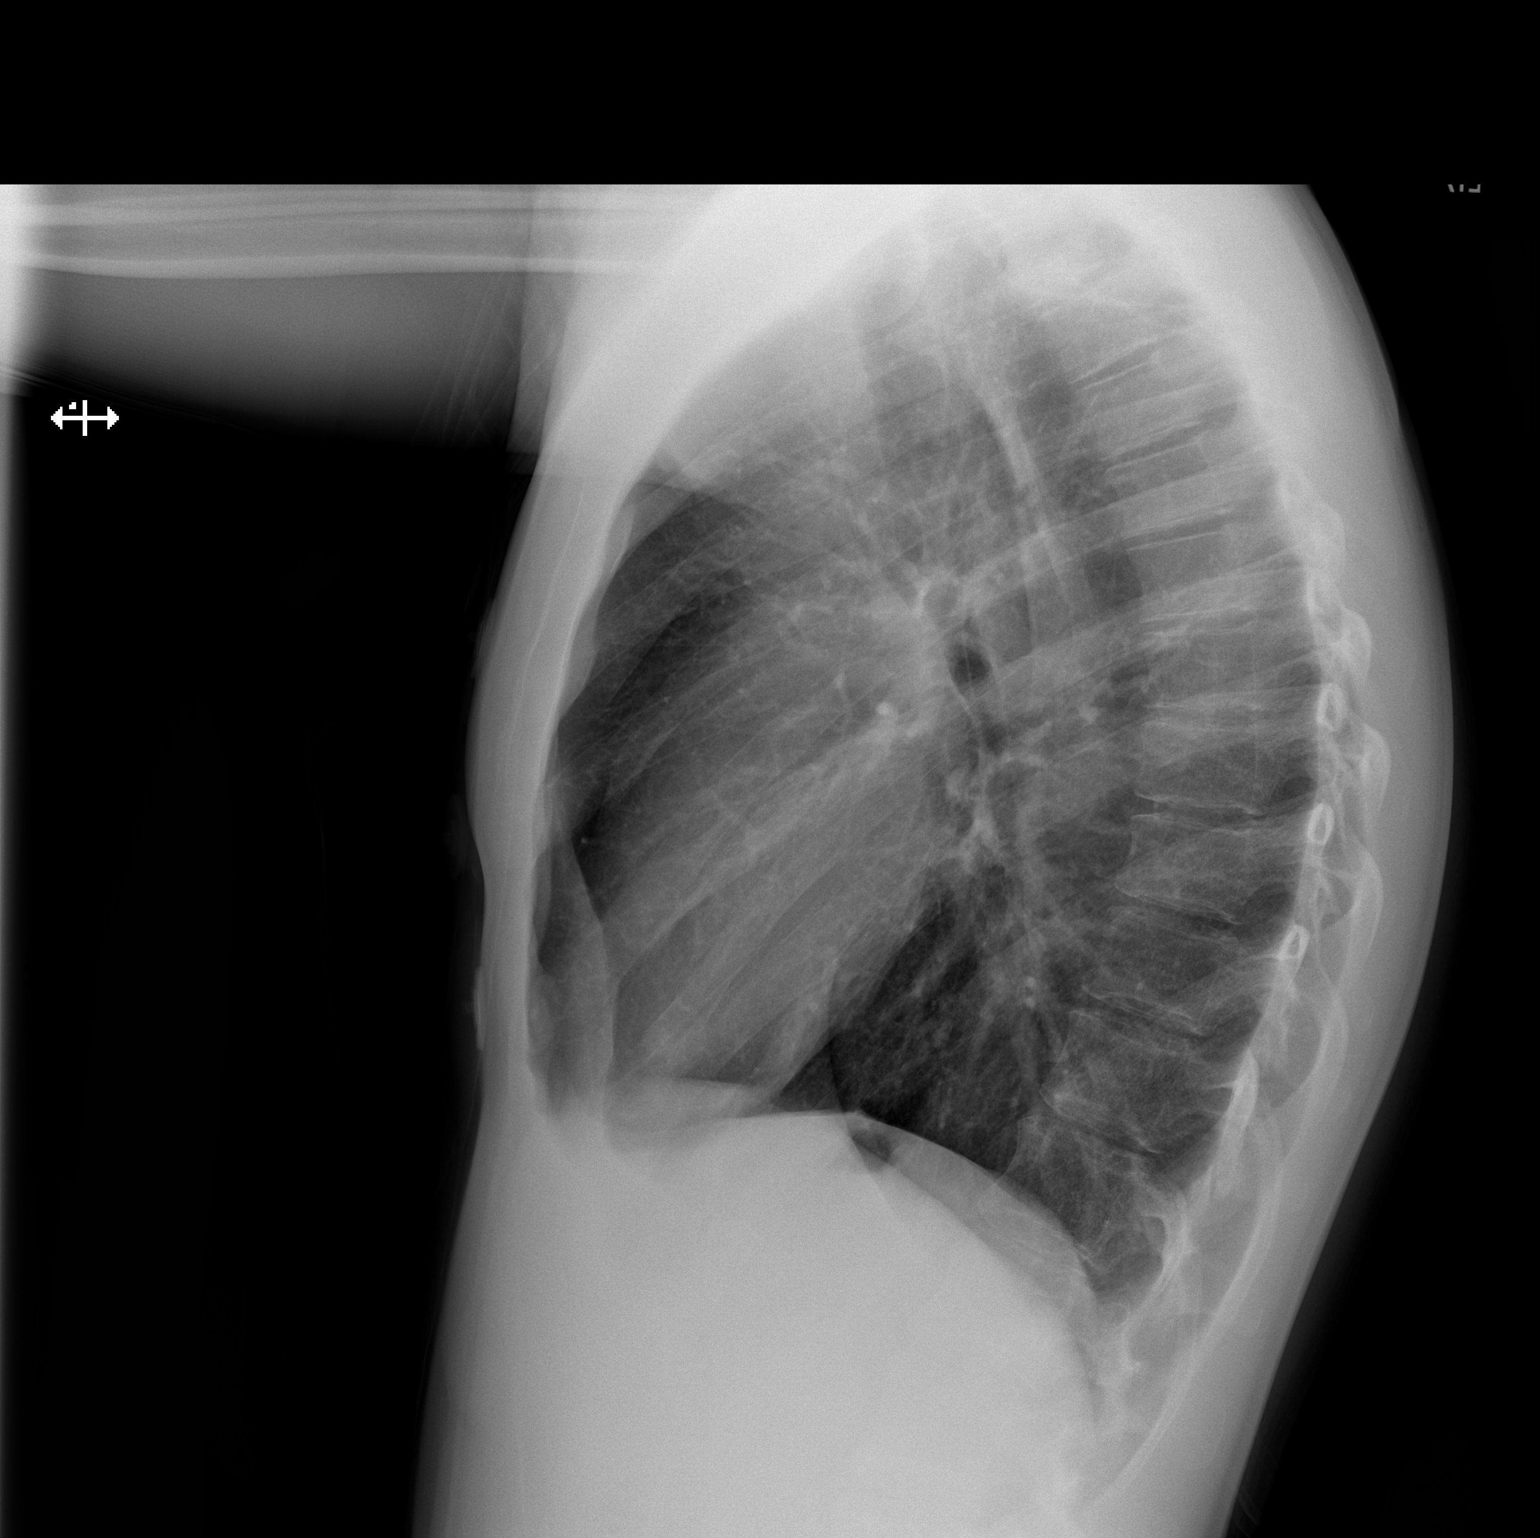

[2 of 2 positions shown; findings below may reference images not displayed]

FINDINGS: The heart size and mediastinal contours are within normal limits.
Both lungs are clear. The visualized skeletal structures are
unremarkable.
IMPRESSION: No active cardiopulmonary disease.

## 2023-11-13 ENCOUNTER — Emergency Department (HOSPITAL_COMMUNITY)
Admission: EM | Admit: 2023-11-13 | Discharge: 2023-11-13 | Disposition: A | Discharge status: Home / Self Care | Payer: Medicaid Other | Attending: Emergency Medicine | Admitting: Emergency Medicine

## 2023-11-13 ENCOUNTER — Other Ambulatory Visit: Payer: Self-pay

## 2023-11-13 ENCOUNTER — Encounter (HOSPITAL_COMMUNITY): Payer: Self-pay

## 2023-11-13 DIAGNOSIS — K0889 Other specified disorders of teeth and supporting structures: Secondary | ICD-10-CM | POA: Diagnosis present

## 2023-11-13 DIAGNOSIS — K029 Dental caries, unspecified: Secondary | ICD-10-CM | POA: Diagnosis not present

## 2023-11-13 MED ORDER — CELECOXIB 200 MG PO CAPS: 200.0000 mg | ORAL_CAPSULE | Freq: Two times a day (BID) | ORAL | 0 refills | Status: AC

## 2023-11-13 MED ORDER — CELECOXIB 100 MG PO CAPS
200.0000 mg | ORAL_CAPSULE | Freq: Once | ORAL | Status: AC
  Administered 2023-11-13: 200 mg via ORAL
  Filled 2023-11-13: qty 2

## 2023-11-13 MED ORDER — TRAMADOL HCL 50 MG PO TABS: 50.0000 mg | ORAL_TABLET | Freq: Four times a day (QID) | ORAL | 0 refills | Status: DC | PRN

## 2023-11-13 MED ORDER — CELECOXIB 200 MG PO CAPS: 200.0000 mg | ORAL_CAPSULE | Freq: Two times a day (BID) | ORAL | 0 refills | Status: DC

## 2023-11-13 MED ORDER — TRAMADOL HCL 50 MG PO TABS: 50.0000 mg | ORAL_TABLET | Freq: Four times a day (QID) | ORAL | 0 refills | Status: AC | PRN

## 2023-11-13 NOTE — Discharge Instructions (Addendum)
[facility] Cleaning Tooth brushing/flossing instruction Sealants, fillings, crowns Extractions Emergency treatment Hours: Tuesdays, Thursdays, and Fridays from 8 am to 5 pm by appointment only. [phone] [address] [place], [place] [address] [facility] residents with Medicaid (depending on eligibility) and children with [facility] Health Choice - call for more information.    You have been seen by your caregiver because of dental pain.  SEEK MEDICAL ATTENTION IF: The exam and treatment you received today has been provided on an emergency basis only. This is not a substitute for complete medical or dental care. If your problem worsens or new symptoms (problems) appear, and you are unable to arrange prompt follow-up care with your dentist, call or return to this location. CALL YOUR DENTIST OR RETURN IMMEDIATELY IF you develop a fever, rash, difficulty breathing or swallowing, neck or facial swelling, or other potentially serious concerns.

## 2023-11-13 NOTE — ED Provider Notes (Signed)
[place] EMERGENCY DEPARTMENT AT [facility] Provider Note   CSN: [id number] Arrival date & time: 11/13/23  4098     History  Chief Complaint  Patient presents with   Dental Pain    Chad Craig is a 28 y.o. male who presents emergency department for evaluation of dental pain.  Patient was seen in urgent care 4 days ago.  He had an abscess on his lower gumline that opened during the evaluation at urgent care.  Patient was placed on clindamycin but continues to have severe pain.  Pain is worse with warm and cold on the tooth, pressure, touch.  He denies difficulty swallowing, fever or chills.  Patient was unable to follow-up with dentist on-call because they do not take Medicaid.  The history is provided by the patient.  Dental Pain      Home Medications Prior to Admission medications   Medication Sig Start Date End Date Taking? Authorizing Provider  celecoxib (CELEBREX) 200 MG capsule Take 1 capsule (200 mg total) by mouth 2 (two) times daily. 11/13/23  Yes Zahi Plaskett, PA-C  traMADol (ULTRAM) 50 MG tablet Take 1 tablet (50 mg total) by mouth every 6 (six) hours as needed. 11/13/23  Yes Kenyetta Fife, PA-C  acetaminophen (TYLENOL) 500 MG tablet Take 500 mg by mouth every 6 (six) hours as needed.    [provider]  lidocaine (XYLOCAINE) 2 % solution Use as directed 15 mLs in the mouth or throat every 4 (four) hours as needed for mouth pain. 10/12/21   Rhys Martini, PA-C  loperamide (IMODIUM) 2 MG capsule Take 1 capsule (2 mg total) by mouth 2 (two) times daily as needed for diarrhea or loose stools. 01/17/22   Wallis Bamberg, PA-C  ondansetron (ZOFRAN-ODT) 8 MG disintegrating tablet Take 1 tablet (8 mg total) by mouth every 8 (eight) hours as needed for nausea or vomiting. 01/17/22   Wallis Bamberg, PA-C  promethazine (PHENERGAN) 25 MG tablet Take 1 tablet (25 mg total) by mouth every 6 (six) hours as needed for nausea or vomiting. May cause drowsiness, no driving  with this medication 01/19/22   Particia Nearing, PA-C  tiZANidine (ZANAFLEX) 4 MG tablet Take 1 tablet (4 mg total) by mouth every 6 (six) hours as needed for muscle spasms. Do not take with alcohol or while driving or operating heavy machinery.  May cause drowsiness. 08/16/22   Valentino Nose, NP      Allergies    Penicillins, Amoxicillin, and Motrin [ibuprofen]    Review of Systems   Review of Systems  Physical Exam Updated Vital Signs BP 105/72 (BP Location: Left Arm)   Pulse 65   Temp 97.9 F (36.6 C) (Oral)   Resp 18   Ht 5\' 7"  (1.702 m)   Wt 59.9 kg   SpO2 98%   BMI 20.68 kg/m  Physical Exam Vitals and nursing note reviewed.  Constitutional:      General: He is not in acute distress.    Appearance: He is well-developed. He is not diaphoretic.  HENT:     Head: Normocephalic and atraumatic.     Comments: Multiple dental caries with severe decay of the right lower molar and premolar.  No sign of abscess at this time.  Exposed pulp and dented noted. Eyes:     General: No scleral icterus.    Conjunctiva/sclera: Conjunctivae normal.  Cardiovascular:     Rate and Rhythm: Normal rate and regular rhythm.  Heart sounds: Normal heart sounds.  Pulmonary:     Effort: Pulmonary effort is normal. No respiratory distress.     Breath sounds: Normal breath sounds.  Abdominal:     Palpations: Abdomen is soft.     Tenderness: There is no abdominal tenderness.  Musculoskeletal:     Cervical back: Normal range of motion and neck supple.  Skin:    General: Skin is warm and dry.  Neurological:     Mental Status: He is alert.  Psychiatric:        Behavior: Behavior normal.     ED Results / Procedures / Treatments   Labs (all labs ordered are listed, but only abnormal results are displayed) Labs Reviewed - No data to display  EKG None  Radiology No results found.  Procedures Procedures    Medications Ordered in ED Medications  celecoxib (CELEBREX)  capsule 200 mg (has no administration in time range)    ED Course/ Medical Decision Making/ A&P                                 Medical Decision Making Risk Prescription drug management.           Final Clinical Impression(s) / ED Diagnoses Final diagnoses:  None   This 28 year old male with poor dentition, no dental abscess, no signs of Ludwig's angina.  Suspect that he is having severe pain due to exposed nerve endings in the tooth and dental caries.  Patient given follow-up with a Medicaid excepting service here in [facility] as well as Writer paperwork.  PDMP reviewed.  Tramadol and Celebrex at discharge.  Discussed return precautions. Rx / DC Orders ED Discharge Orders          Ordered    traMADol (ULTRAM) 50 MG tablet  Every 6 hours PRN        11/13/23 0838    celecoxib (CELEBREX) 200 MG capsule  2 times daily        11/13/23 0838              Arthor Captain, PA-C 11/13/23 5284    Pricilla Loveless, MD 11/16/23 [phone]

## 2023-11-13 NOTE — ED Triage Notes (Signed)
Pt c/o right lower back tooth pain. Per pt he was seen at [facility] on Thursday and they told him he needed to go to a dentist and referred him to one that does not take medicaid. Pt states they put him on clindamycin but the tooth is not getting any better.
